# Patient Record
Sex: Male | Born: 1935
Health system: Southern US, Community
[De-identification: ages and names within clinical notes are randomized; demographics above are authoritative.]

## PROBLEM LIST (undated history)

## (undated) DIAGNOSIS — C61 Malignant neoplasm of prostate: Secondary | ICD-10-CM

## (undated) DIAGNOSIS — I10 Essential (primary) hypertension: Secondary | ICD-10-CM

## (undated) DIAGNOSIS — Z87442 Personal history of urinary calculi: Secondary | ICD-10-CM

## (undated) DIAGNOSIS — M1990 Unspecified osteoarthritis, unspecified site: Secondary | ICD-10-CM

## (undated) HISTORY — PX: PROSTATE BIOPSY: SHX241

## (undated) HISTORY — PX: EYE SURGERY: SHX253

---

## 2009-01-21 ENCOUNTER — Emergency Department (HOSPITAL_COMMUNITY): Admission: EM | Admit: 2009-01-21 | Discharge: 2009-01-21 | Payer: Self-pay | Admitting: Emergency Medicine

## 2010-04-26 ENCOUNTER — Telehealth (INDEPENDENT_AMBULATORY_CARE_PROVIDER_SITE_OTHER): Payer: Self-pay | Admitting: *Deleted

## 2010-04-30 ENCOUNTER — Encounter: Payer: Self-pay | Admitting: Internal Medicine

## 2010-06-08 ENCOUNTER — Ambulatory Visit (HOSPITAL_COMMUNITY): Admission: RE | Admit: 2010-06-08 | Discharge: 2010-06-08 | Payer: Self-pay | Admitting: Internal Medicine

## 2010-06-08 ENCOUNTER — Ambulatory Visit: Payer: Self-pay | Admitting: Internal Medicine

## 2010-06-12 ENCOUNTER — Encounter: Payer: Self-pay | Admitting: Internal Medicine

## 2010-12-18 NOTE — Letter (Signed)
Summary: TRAIGE ORDER  TRAIGE ORDER   Imported By: Ave Filter 04/30/2010 12:04:54  _____________________________________________________________________  External Attachment:    Type:   Image     Comment:   External Document

## 2010-12-18 NOTE — Letter (Signed)
Summary: Patient Notice, Colon Biopsy Results  [facility]  [address]   [place], [place] [address]   Phone: [phone]  Fax: [phone]       June 12, 2010   MONTOYA BRANDEL [address] [place], [place]  [address] 1934/01/04    Dear Mr. Woo,  I am pleased to inform you that the biopsies taken during your recent colonoscopy did not show any evidence of cancer upon pathologic examination.  Additional information/recommendations:  No further action is needed at this time.  Please follow-up with your primary care physician for your other healthcare needs.  You should have a repeat colonoscopy examination  in 7 years.  Please call us if you are having persistent problems or have questions about your condition that have not been fully answered at this time.  Sincerely,    R. Roetta Sessions MD, FACP [facility] Ph: [phone]    Fax: [phone]   Appended Document: Patient Notice, Colon Biopsy Results Letter mailed to pt.  Appended Document: Patient Notice, Colon Biopsy Results reminider in computer

## 2010-12-18 NOTE — Progress Notes (Signed)
Summary: [facility] T[facility]  Phone Note Call from Patient   Reason for Call: Talk to Nurse Summary of Call: Pt wants to [facility] his TCS that is scheduled for July 7th. Please call him back [phone]. He doesn't want it on a Monday. He said to just leave a message for the next available if he doesnt answer.  Initial call taken by: Diana Eves,  April 26, 2010 10:27 AM     Appended Document: [facility] TCS LMOM to call.  Appended Document: [facility] TCS Pt's appt is for 06/08/2010 @ 7:30 AM  and Kimm is aware.  Appended Document: [facility] TCS LMOM of the above. Pt to call back and confirm.  Appended Document: [facility] TCS LMOM to call and confirm.  Appended Document: [facility] TCS Pt called, i confirmed his new time for his TCS and time to arrive. Medications were reviewed. No changes in meds and no new problems.

## 2012-05-05 DIAGNOSIS — I10 Essential (primary) hypertension: Secondary | ICD-10-CM | POA: Diagnosis not present

## 2012-10-28 DIAGNOSIS — Z79899 Other long term (current) drug therapy: Secondary | ICD-10-CM | POA: Diagnosis not present

## 2012-10-28 DIAGNOSIS — I10 Essential (primary) hypertension: Secondary | ICD-10-CM | POA: Diagnosis not present

## 2012-11-04 DIAGNOSIS — N390 Urinary tract infection, site not specified: Secondary | ICD-10-CM | POA: Diagnosis not present

## 2012-11-04 DIAGNOSIS — I10 Essential (primary) hypertension: Secondary | ICD-10-CM | POA: Diagnosis not present

## 2012-11-04 DIAGNOSIS — Z23 Encounter for immunization: Secondary | ICD-10-CM | POA: Diagnosis not present

## 2013-05-06 DIAGNOSIS — I10 Essential (primary) hypertension: Secondary | ICD-10-CM | POA: Diagnosis not present

## 2013-11-01 DIAGNOSIS — N400 Enlarged prostate without lower urinary tract symptoms: Secondary | ICD-10-CM | POA: Diagnosis not present

## 2013-11-01 DIAGNOSIS — Z125 Encounter for screening for malignant neoplasm of prostate: Secondary | ICD-10-CM | POA: Diagnosis not present

## 2013-11-01 DIAGNOSIS — Z79899 Other long term (current) drug therapy: Secondary | ICD-10-CM | POA: Diagnosis not present

## 2013-11-01 DIAGNOSIS — I10 Essential (primary) hypertension: Secondary | ICD-10-CM | POA: Diagnosis not present

## 2013-11-08 DIAGNOSIS — Z23 Encounter for immunization: Secondary | ICD-10-CM | POA: Diagnosis not present

## 2013-11-08 DIAGNOSIS — I10 Essential (primary) hypertension: Secondary | ICD-10-CM | POA: Diagnosis not present

## 2014-05-09 DIAGNOSIS — I10 Essential (primary) hypertension: Secondary | ICD-10-CM | POA: Diagnosis not present

## 2014-05-09 DIAGNOSIS — K089 Disorder of teeth and supporting structures, unspecified: Secondary | ICD-10-CM | POA: Diagnosis not present

## 2014-11-02 DIAGNOSIS — I10 Essential (primary) hypertension: Secondary | ICD-10-CM | POA: Diagnosis not present

## 2014-11-02 DIAGNOSIS — Z125 Encounter for screening for malignant neoplasm of prostate: Secondary | ICD-10-CM | POA: Diagnosis not present

## 2014-11-02 DIAGNOSIS — Z79899 Other long term (current) drug therapy: Secondary | ICD-10-CM | POA: Diagnosis not present

## 2014-11-09 DIAGNOSIS — Z23 Encounter for immunization: Secondary | ICD-10-CM | POA: Diagnosis not present

## 2014-11-09 DIAGNOSIS — I10 Essential (primary) hypertension: Secondary | ICD-10-CM | POA: Diagnosis not present

## 2014-11-09 DIAGNOSIS — N400 Enlarged prostate without lower urinary tract symptoms: Secondary | ICD-10-CM | POA: Diagnosis not present

## 2014-11-09 DIAGNOSIS — N3000 Acute cystitis without hematuria: Secondary | ICD-10-CM | POA: Diagnosis not present

## 2014-12-01 DIAGNOSIS — L723 Sebaceous cyst: Secondary | ICD-10-CM | POA: Diagnosis not present

## 2014-12-01 DIAGNOSIS — D225 Melanocytic nevi of trunk: Secondary | ICD-10-CM | POA: Diagnosis not present

## 2015-05-10 DIAGNOSIS — Z125 Encounter for screening for malignant neoplasm of prostate: Secondary | ICD-10-CM | POA: Diagnosis not present

## 2015-05-25 DIAGNOSIS — N400 Enlarged prostate without lower urinary tract symptoms: Secondary | ICD-10-CM | POA: Diagnosis not present

## 2015-05-25 DIAGNOSIS — I10 Essential (primary) hypertension: Secondary | ICD-10-CM | POA: Diagnosis not present

## 2015-11-22 DIAGNOSIS — N400 Enlarged prostate without lower urinary tract symptoms: Secondary | ICD-10-CM | POA: Diagnosis not present

## 2015-11-22 DIAGNOSIS — Z79899 Other long term (current) drug therapy: Secondary | ICD-10-CM | POA: Diagnosis not present

## 2015-11-22 DIAGNOSIS — Z125 Encounter for screening for malignant neoplasm of prostate: Secondary | ICD-10-CM | POA: Diagnosis not present

## 2015-11-22 DIAGNOSIS — I10 Essential (primary) hypertension: Secondary | ICD-10-CM | POA: Diagnosis not present

## 2015-11-29 DIAGNOSIS — N3000 Acute cystitis without hematuria: Secondary | ICD-10-CM | POA: Diagnosis not present

## 2015-11-29 DIAGNOSIS — N390 Urinary tract infection, site not specified: Secondary | ICD-10-CM | POA: Diagnosis not present

## 2015-11-29 DIAGNOSIS — N400 Enlarged prostate without lower urinary tract symptoms: Secondary | ICD-10-CM | POA: Diagnosis not present

## 2015-11-29 DIAGNOSIS — Z6835 Body mass index (BMI) 35.0-35.9, adult: Secondary | ICD-10-CM | POA: Diagnosis not present

## 2015-11-29 DIAGNOSIS — I10 Essential (primary) hypertension: Secondary | ICD-10-CM | POA: Diagnosis not present

## 2016-05-23 DIAGNOSIS — N400 Enlarged prostate without lower urinary tract symptoms: Secondary | ICD-10-CM | POA: Diagnosis not present

## 2016-05-23 DIAGNOSIS — R972 Elevated prostate specific antigen [PSA]: Secondary | ICD-10-CM | POA: Diagnosis not present

## 2016-05-29 DIAGNOSIS — I10 Essential (primary) hypertension: Secondary | ICD-10-CM | POA: Diagnosis not present

## 2016-05-29 DIAGNOSIS — N400 Enlarged prostate without lower urinary tract symptoms: Secondary | ICD-10-CM | POA: Diagnosis not present

## 2016-08-30 DIAGNOSIS — Z23 Encounter for immunization: Secondary | ICD-10-CM | POA: Diagnosis not present

## 2016-11-27 DIAGNOSIS — Z79899 Other long term (current) drug therapy: Secondary | ICD-10-CM | POA: Diagnosis not present

## 2016-11-27 DIAGNOSIS — Z125 Encounter for screening for malignant neoplasm of prostate: Secondary | ICD-10-CM | POA: Diagnosis not present

## 2016-11-27 DIAGNOSIS — I10 Essential (primary) hypertension: Secondary | ICD-10-CM | POA: Diagnosis not present

## 2016-12-04 DIAGNOSIS — N400 Enlarged prostate without lower urinary tract symptoms: Secondary | ICD-10-CM | POA: Diagnosis not present

## 2016-12-04 DIAGNOSIS — Z6835 Body mass index (BMI) 35.0-35.9, adult: Secondary | ICD-10-CM | POA: Diagnosis not present

## 2016-12-04 DIAGNOSIS — I10 Essential (primary) hypertension: Secondary | ICD-10-CM | POA: Diagnosis not present

## 2016-12-30 DIAGNOSIS — B0089 Other herpesviral infection: Secondary | ICD-10-CM | POA: Diagnosis not present

## 2017-01-09 DIAGNOSIS — D040 Carcinoma in situ of skin of lip: Secondary | ICD-10-CM | POA: Diagnosis not present

## 2017-02-05 DIAGNOSIS — C4402 Squamous cell carcinoma of skin of lip: Secondary | ICD-10-CM | POA: Diagnosis not present

## 2017-02-05 DIAGNOSIS — D040 Carcinoma in situ of skin of lip: Secondary | ICD-10-CM | POA: Diagnosis not present

## 2017-03-10 DIAGNOSIS — D040 Carcinoma in situ of skin of lip: Secondary | ICD-10-CM | POA: Diagnosis not present

## 2017-03-31 DIAGNOSIS — D040 Carcinoma in situ of skin of lip: Secondary | ICD-10-CM | POA: Diagnosis not present

## 2017-03-31 DIAGNOSIS — C4402 Squamous cell carcinoma of skin of lip: Secondary | ICD-10-CM | POA: Diagnosis not present

## 2017-04-24 ENCOUNTER — Encounter: Payer: Self-pay | Admitting: Internal Medicine

## 2017-04-28 DIAGNOSIS — Z85828 Personal history of other malignant neoplasm of skin: Secondary | ICD-10-CM | POA: Diagnosis not present

## 2017-04-28 DIAGNOSIS — Z08 Encounter for follow-up examination after completed treatment for malignant neoplasm: Secondary | ICD-10-CM | POA: Diagnosis not present

## 2017-05-28 DIAGNOSIS — R972 Elevated prostate specific antigen [PSA]: Secondary | ICD-10-CM | POA: Diagnosis not present

## 2017-06-05 DIAGNOSIS — R972 Elevated prostate specific antigen [PSA]: Secondary | ICD-10-CM | POA: Diagnosis not present

## 2017-06-05 DIAGNOSIS — I10 Essential (primary) hypertension: Secondary | ICD-10-CM | POA: Diagnosis not present

## 2017-09-10 DIAGNOSIS — Z23 Encounter for immunization: Secondary | ICD-10-CM | POA: Diagnosis not present

## 2017-12-04 DIAGNOSIS — R972 Elevated prostate specific antigen [PSA]: Secondary | ICD-10-CM | POA: Diagnosis not present

## 2017-12-04 DIAGNOSIS — Z125 Encounter for screening for malignant neoplasm of prostate: Secondary | ICD-10-CM | POA: Diagnosis not present

## 2017-12-04 DIAGNOSIS — Z79899 Other long term (current) drug therapy: Secondary | ICD-10-CM | POA: Diagnosis not present

## 2017-12-04 DIAGNOSIS — I10 Essential (primary) hypertension: Secondary | ICD-10-CM | POA: Diagnosis not present

## 2017-12-11 DIAGNOSIS — N390 Urinary tract infection, site not specified: Secondary | ICD-10-CM | POA: Diagnosis not present

## 2017-12-11 DIAGNOSIS — I10 Essential (primary) hypertension: Secondary | ICD-10-CM | POA: Diagnosis not present

## 2017-12-11 DIAGNOSIS — Z6835 Body mass index (BMI) 35.0-35.9, adult: Secondary | ICD-10-CM | POA: Diagnosis not present

## 2017-12-11 DIAGNOSIS — N3000 Acute cystitis without hematuria: Secondary | ICD-10-CM | POA: Diagnosis not present

## 2018-01-14 DIAGNOSIS — R972 Elevated prostate specific antigen [PSA]: Secondary | ICD-10-CM | POA: Insufficient documentation

## 2018-01-16 DIAGNOSIS — N138 Other obstructive and reflux uropathy: Secondary | ICD-10-CM | POA: Diagnosis not present

## 2018-01-16 DIAGNOSIS — R972 Elevated prostate specific antigen [PSA]: Secondary | ICD-10-CM | POA: Diagnosis not present

## 2018-01-16 DIAGNOSIS — N401 Enlarged prostate with lower urinary tract symptoms: Secondary | ICD-10-CM | POA: Diagnosis not present

## 2018-04-16 DIAGNOSIS — N138 Other obstructive and reflux uropathy: Secondary | ICD-10-CM | POA: Diagnosis not present

## 2018-04-16 DIAGNOSIS — N401 Enlarged prostate with lower urinary tract symptoms: Secondary | ICD-10-CM | POA: Diagnosis not present

## 2018-04-16 DIAGNOSIS — R972 Elevated prostate specific antigen [PSA]: Secondary | ICD-10-CM | POA: Diagnosis not present

## 2018-05-15 DIAGNOSIS — R972 Elevated prostate specific antigen [PSA]: Secondary | ICD-10-CM | POA: Diagnosis not present

## 2018-05-15 DIAGNOSIS — N138 Other obstructive and reflux uropathy: Secondary | ICD-10-CM | POA: Diagnosis not present

## 2018-05-15 DIAGNOSIS — N401 Enlarged prostate with lower urinary tract symptoms: Secondary | ICD-10-CM | POA: Diagnosis not present

## 2018-06-04 DIAGNOSIS — R972 Elevated prostate specific antigen [PSA]: Secondary | ICD-10-CM | POA: Diagnosis not present

## 2018-06-04 DIAGNOSIS — I10 Essential (primary) hypertension: Secondary | ICD-10-CM | POA: Diagnosis not present

## 2018-07-01 DIAGNOSIS — R972 Elevated prostate specific antigen [PSA]: Secondary | ICD-10-CM | POA: Diagnosis not present

## 2018-07-13 DIAGNOSIS — R972 Elevated prostate specific antigen [PSA]: Secondary | ICD-10-CM | POA: Diagnosis not present

## 2018-09-30 DIAGNOSIS — Z23 Encounter for immunization: Secondary | ICD-10-CM | POA: Diagnosis not present

## 2018-11-02 DIAGNOSIS — R972 Elevated prostate specific antigen [PSA]: Secondary | ICD-10-CM | POA: Diagnosis not present

## 2018-11-02 DIAGNOSIS — C61 Malignant neoplasm of prostate: Secondary | ICD-10-CM | POA: Diagnosis not present

## 2018-11-26 DIAGNOSIS — I10 Essential (primary) hypertension: Secondary | ICD-10-CM | POA: Diagnosis not present

## 2018-11-26 DIAGNOSIS — R972 Elevated prostate specific antigen [PSA]: Secondary | ICD-10-CM | POA: Diagnosis not present

## 2018-11-26 DIAGNOSIS — Z125 Encounter for screening for malignant neoplasm of prostate: Secondary | ICD-10-CM | POA: Diagnosis not present

## 2018-11-26 DIAGNOSIS — Z79899 Other long term (current) drug therapy: Secondary | ICD-10-CM | POA: Diagnosis not present

## 2018-12-03 DIAGNOSIS — N183 Chronic kidney disease, stage 3 (moderate): Secondary | ICD-10-CM | POA: Diagnosis not present

## 2018-12-03 DIAGNOSIS — R972 Elevated prostate specific antigen [PSA]: Secondary | ICD-10-CM | POA: Diagnosis not present

## 2018-12-03 DIAGNOSIS — I129 Hypertensive chronic kidney disease with stage 1 through stage 4 chronic kidney disease, or unspecified chronic kidney disease: Secondary | ICD-10-CM | POA: Diagnosis not present

## 2019-02-01 DIAGNOSIS — R972 Elevated prostate specific antigen [PSA]: Secondary | ICD-10-CM | POA: Diagnosis not present

## 2019-05-03 DIAGNOSIS — R972 Elevated prostate specific antigen [PSA]: Secondary | ICD-10-CM | POA: Diagnosis not present

## 2019-05-03 DIAGNOSIS — C61 Malignant neoplasm of prostate: Secondary | ICD-10-CM | POA: Diagnosis not present

## 2019-05-31 DIAGNOSIS — R972 Elevated prostate specific antigen [PSA]: Secondary | ICD-10-CM | POA: Diagnosis not present

## 2019-05-31 DIAGNOSIS — N183 Chronic kidney disease, stage 3 (moderate): Secondary | ICD-10-CM | POA: Diagnosis not present

## 2019-05-31 DIAGNOSIS — N400 Enlarged prostate without lower urinary tract symptoms: Secondary | ICD-10-CM | POA: Diagnosis not present

## 2019-05-31 DIAGNOSIS — I10 Essential (primary) hypertension: Secondary | ICD-10-CM | POA: Diagnosis not present

## 2019-06-07 DIAGNOSIS — R972 Elevated prostate specific antigen [PSA]: Secondary | ICD-10-CM | POA: Diagnosis not present

## 2019-06-07 DIAGNOSIS — N183 Chronic kidney disease, stage 3 (moderate): Secondary | ICD-10-CM | POA: Diagnosis not present

## 2019-06-07 DIAGNOSIS — I10 Essential (primary) hypertension: Secondary | ICD-10-CM | POA: Diagnosis not present

## 2019-08-31 DIAGNOSIS — Z23 Encounter for immunization: Secondary | ICD-10-CM | POA: Diagnosis not present

## 2019-09-06 DIAGNOSIS — C61 Malignant neoplasm of prostate: Secondary | ICD-10-CM | POA: Diagnosis not present

## 2019-09-06 DIAGNOSIS — R972 Elevated prostate specific antigen [PSA]: Secondary | ICD-10-CM | POA: Diagnosis not present

## 2019-09-13 DIAGNOSIS — C61 Malignant neoplasm of prostate: Secondary | ICD-10-CM | POA: Diagnosis not present

## 2019-09-13 DIAGNOSIS — R972 Elevated prostate specific antigen [PSA]: Secondary | ICD-10-CM | POA: Diagnosis not present

## 2019-09-22 DIAGNOSIS — D4959 Neoplasm of unspecified behavior of other genitourinary organ: Secondary | ICD-10-CM | POA: Diagnosis not present

## 2019-09-22 DIAGNOSIS — C61 Malignant neoplasm of prostate: Secondary | ICD-10-CM | POA: Diagnosis not present

## 2019-09-23 ENCOUNTER — Encounter: Payer: Self-pay | Admitting: *Deleted

## 2019-09-23 ENCOUNTER — Other Ambulatory Visit: Payer: Self-pay | Admitting: Radiation Oncology

## 2019-09-23 ENCOUNTER — Ambulatory Visit
Admission: RE | Admit: 2019-09-23 | Discharge: 2019-09-23 | Disposition: A | Discharge status: Home / Self Care | Payer: Self-pay | Source: Ambulatory Visit | Attending: Radiation Oncology | Admitting: Radiation Oncology

## 2019-09-23 DIAGNOSIS — C61 Malignant neoplasm of prostate: Secondary | ICD-10-CM

## 2019-09-23 NOTE — Progress Notes (Signed)
GU Location of Tumor / Histology: prostatic adenocarcinoma  If Prostate Cancer, Gleason Score is (4 + 3) and PSA is (17.10). Prostate volume: 17.10  Alyse Low Bagwell has had three prostate biopsies. His first prostate biopsy was done in 2005 and turned out negative. His PSA continued to uptrend prompting another biopsy in June 2019 revealing Gleason 6 disease. The patient elevated to proceed with active surveillance at that time.  Biopsies of prostate (if applicable) revealed:    Past/Anticipated interventions by urology, if any: prostate biopsy x 3, referral to [facility]  Past/Anticipated interventions by medical oncology, if any: no  Weight changes, if any: denies  Bowel/Bladder complaints, if any: IPSS 2. SHIM 3. Denies dysuria or hematuria. Denies urinary leakage or incontinence. Denies any bowel complaints.  Nausea/Vomiting, if any: denies  Pain issues, if any:  denies  SAFETY ISSUES:  Prior radiation? denies  Pacemaker/ICD? denies  Possible current pregnancy? no, male patient  Is the patient on methotrexate? denies  Current Complaints / other details:  86 year old male. Owns and operates a Doctor, general practice. Seen by Dr. Tharon Aquas at [facility]. Per Rolland Porter notes patient isn't a surgical candidate, ADT wouldn't be beneficial compared to his age, and the patient desires to seek radiation therapy closer to home. MRI body/abd/pelvis done yesterday.

## 2019-09-24 ENCOUNTER — Other Ambulatory Visit: Payer: Self-pay

## 2019-09-24 ENCOUNTER — Encounter: Payer: Self-pay | Admitting: Radiation Oncology

## 2019-09-24 ENCOUNTER — Ambulatory Visit
Admission: RE | Admit: 2019-09-24 | Discharge: 2019-09-24 | Disposition: A | Discharge status: Home / Self Care | Payer: Medicare Other | Source: Ambulatory Visit | Attending: Radiation Oncology | Admitting: Radiation Oncology

## 2019-09-24 VITALS — Ht 74.0 in | Wt 257.0 lb

## 2019-09-24 DIAGNOSIS — D4959 Neoplasm of unspecified behavior of other genitourinary organ: Secondary | ICD-10-CM

## 2019-09-24 DIAGNOSIS — C61 Malignant neoplasm of prostate: Secondary | ICD-10-CM | POA: Insufficient documentation

## 2019-09-24 DIAGNOSIS — R972 Elevated prostate specific antigen [PSA]: Secondary | ICD-10-CM | POA: Diagnosis not present

## 2019-09-24 HISTORY — DX: Malignant neoplasm of prostate: C61

## 2019-09-24 HISTORY — DX: Essential (primary) hypertension: I10

## 2019-09-24 NOTE — Progress Notes (Signed)
See progress note under physician encounter. 

## 2019-09-24 NOTE — Progress Notes (Signed)
Radiation Oncology         [phone] ________________________________  Initial outpatient Consultation - Conducted via Telephone due to current COVID-19 concerns for limiting patient exposure  Name: Logan Olson MRN: [id number]  Date: 09/24/2019  DOB: June 03, 1934  CC:No primary care provider on file.  No ref. provider found   REFERRING PHYSICIAN: No ref. provider found  DIAGNOSIS: 85 y.o. 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    ICD-10-CM   1. Malignant neoplasm of prostate ([place])  C61     HISTORY OF PRESENT ILLNESS: Logan Olson is a 85 y.o. male with a diagnosis of prostate cancer. He has a long standing history of elevated PSA up to 9.31 in 2005.  He was referred to Dr. Lawerance Bach and underwent prostate biopsy at that time, which was negative.  He believes that he had an infection at that time and reports that his PSA normalized thereafter. The PSA was 4.78 in 03/2010 and decreased to 3.42 in 06/2010.  His PSA rose again to 10.27 in 03/2018 and has remained above 10 since that time.  He was referred back to Dr. Rosana Hoes in 01/2018 and has continued in routine follow up under his care since that time.  A digital rectal examination performed in 01/2018 did not demonstrate any concerning findings or nodules.  He had a prostate MRI on 07/01/2018,  For further evalaution and this revealed a 2.6 cm right base/mid PIRADS-5 lesion with findings suspicious for extra prostatic extension.  He then proceeded to repeat transrectal ultrasound with 12 biopsies of the prostate on 07/13/2018.  The prostate volume measured 95 cc.  Out of 13 core biopsies, 1 was positive.  The maximum Gleason score was 3+3, and this was seen in one right mid core. He elected to proceed with active surveillance at that time and has remained diligent in follow up.   His PSA has continued to rise, gradually since that time and most recently was up to 17.10 on  09/06/2019.  He had a repeat surveillance biopsy on 09/13/2019 with the prostate volume measuring 64.68 grams at that time. Out of 13 core biopsies, 4 were positive. The maximum Gleason score was 4+3 and this was seen in both right mid cores.  Additionally, Gleason 3+4 was seen in one left mid core (with perineural invasion) and Gleason 3+3 in one left apex core.  Given his advanced age, he was not felt to be a good surgical candidate so he was referred to Dr. Ysidro Evert in radiation oncology at [facility] to discuss treatment options. They discussed the role of ADT, which Dr. Ysidro Evert felt would be of little benefit and instead recommended hypofractionated radiotherapy alone. He also recommended prostate MRI and bone scan to complete his disease staging prior to proceeding with radiation therapy. The patient was in agreement with the recommendations but preferred to be treated closer to home.  Therefore, he has kindly been referred today for discussion of potential radiation treatment options locally.  PREVIOUS RADIATION THERAPY: No  PAST MEDICAL HISTORY:  Past Medical History:  Diagnosis Date  . Hypertension   . Prostate cancer ([place])       PAST SURGICAL HISTORY: Past Surgical History:  Procedure Laterality Date  . PROSTATE BIOPSY      FAMILY HISTORY:  Family History  Problem Relation Age of Onset  . Breast cancer Sister   . Colon cancer Neg Hx   . Pancreatic  cancer Neg Hx   . Prostate cancer Neg Hx     SOCIAL HISTORY:  Social History   Socioeconomic History  . Marital status: Married    Spouse name: Not on file  . Number of children: Not on file  . Years of education: Not on file  . Highest education level: Not on file  Occupational History  . Not on file  Social Needs  . Financial resource strain: Not on file  . Food insecurity    Worry: Not on file    Inability: Not on file  . Transportation needs    Medical: Not on file    Non-medical: Not on file  Tobacco Use   . Smoking status: Never Smoker  . Smokeless tobacco: Never Used  Substance and Sexual Activity  . Alcohol use: Never    Frequency: Never  . Drug use: Never  . Sexual activity: Not Currently  Lifestyle  . Physical activity    Days per week: Not on file    Minutes per session: Not on file  . Stress: Not on file  Relationships  . Social Herbalist on phone: Not on file    Gets together: Not on file    Attends religious service: Not on file    Active member of club or organization: Not on file    Attends meetings of clubs or organizations: Not on file    Relationship status: Not on file  . Intimate partner violence    Fear of current or ex partner: Not on file    Emotionally abused: Not on file    Physically abused: Not on file    Forced sexual activity: Not on file  Other Topics Concern  . Not on file  Social History Narrative  . Not on file    ALLERGIES: Patient has no known allergies.  MEDICATIONS:  Current Outpatient Medications  Medication Sig Dispense Refill  . amLODipine (NORVASC) 5 MG tablet     . losartan-hydrochlorothiazide (HYZAAR) 100-12.5 MG tablet Take by mouth.    . TELMISARTAN PO      No current facility-administered medications for this encounter.     REVIEW OF SYSTEMS:  On review of systems, the patient reports that he is doing well overall. He denies any chest pain, shortness of breath, cough, fevers, chills, night sweats, unintended weight changes. He denies any bowel disturbances, and denies abdominal pain, nausea or vomiting. He denies any new musculoskeletal or joint aches or pains. His IPSS was 2, indicating mild urinary symptoms. His SHIM was 3, indicating he has severe erectile dysfunction.  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  A complete review of systems is obtained and is otherwise negative.    PHYSICAL EXAM:  Wt Readings from Last 3 Encounters:  09/24/19 257 lb (116.6 kg)   Temp Readings from Last 3  Encounters:  No data found for Temp   BP Readings from Last 3 Encounters:  No data found for BP   Pulse Readings from Last 3 Encounters:  No data found for Pulse   Pain Assessment Pain Score: 0-No pain/10  Physical exam not performed in light of telephone encounter.   KPS = 90  100 - Normal; no complaints; no evidence of disease. 90   - Able to carry on normal activity; minor signs or symptoms of disease. 80   - Normal activity with effort; some signs or symptoms of disease. 22   - Cares for self; unable  to carry on normal activity or to do active work. 60   - Requires occasional assistance, but is able to care for most of his personal needs. 50   - Requires considerable assistance and frequent medical care. 23   - Disabled; requires special care and assistance. 57   - Severely disabled; hospital admission is indicated although death not imminent. 2   - Very sick; hospital admission necessary; active supportive treatment necessary. 10   - Moribund; fatal processes progressing rapidly. 0     - Dead  Karnofsky DA, Abelmann WH, Craver LS and Burchenal JH [phone]) The use of the nitrogen mustards in the palliative treatment of carcinoma: with particular reference to bronchogenic carcinoma Cancer 1 634-56  LABORATORY DATA:  No results found for: WBC, HGB, HCT, MCV, PLT No results found for: NA, K, CL, CO2 No results found for: ALT, AST, GGT, ALKPHOS, BILITOT   RADIOGRAPHY: No results found.    IMPRESSION/PLAN: This visit was conducted via Telephone to spare the patient unnecessary potential exposure in the healthcare setting during the current COVID-19 pandemic.  1. 85 y.o. gentleman with Stage T2c adenocarcinoma of the prostate with Gleason Score of 4+3, and PSA of 17.1. We discussed the patient's workup and outlined the nature of prostate cancer in this setting. The patient's T stage, Gleason's score, and PSA put him into the unfavorable intermediate risk group. Accordingly, he  is eligible for a variety of potential treatment options including brachytherapy or 5.5 weeks of external radiation. We discussed the available radiation techniques, and focused on the details and logistics and delivery. The patient is not felt to be an ideal candidate for brachytherapy with a prostate volume of 64.68 grams and advanced age. We discussed and outlined the risks, benefits, short and long-term effects associated with radiotherapy and compared and contrasted these with prostatectomy. We reviewed the role of ADT in the treatment of unfavorable intermediate risk prostate cancer and outlined the associated side effects that could be expected with this therapy. We agree with Dr. Wilson Singer recommendation to forego ADT at this time as the risks and negative impact on his quality of life would outweigh the very small benefit.  He and his wife were encouraged to ask questions that were answered to their stated satisfaction.  He appears to have a good understanding of his disease and our treatment recommendations which are of curative intent.  At the end of the conversation, the patient is interested in moving forward with 5.5 weeks of external beam therapy.  We will coordinate for a bone scan and CT A/P to be performed in the next week and pending there are no unexpected findings, will proceed with treatment planning accordingly.  We will share our discussion with Dr. Rosana Hoes and make arrangements for fiducial marker placement prior to simulation/treatment planning in anticipation of beginning prostate IMRT in the near future. He is comfortable with and in agreement with the stated plan.  Given current concerns for patient exposure during the COVID-19 pandemic, this encounter was conducted via telephone. The patient was notified in advance and was offered a MyChart meeting to allow for face to face communication but unfortunately reported that he did not have the appropriate resources/technology to support  such a visit and instead preferred to proceed with telephone consult. The patient has given verbal consent for this type of encounter. The time spent during this encounter was 45 minutes. The attendants for this meeting include Tyler Pita MD, Ashlyn Bruning PA-C, [place], patient,  Lucinda Dell and his wife. During the encounter, Tyler Pita MD, Ashlyn Bruning PA-C, and scribe, Wilburn Mylar were located at [place].  Patient, Logan Olson and his wife were located at home.    Nicholos Johns, PA-C    Tyler Pita, MD  [facility] Direct Dial: [phone]  Fax: [phone] [email or web address]  Skype  [email or web address]  This document serves as a record of services personally performed by Tyler Pita, MD and Freeman Caldron, PA-C. It was created on their behalf by Wilburn Mylar, a trained medical scribe. The creation of this record is based on the scribe's personal observations and the provider's statements to them. This document has been checked and approved by the attending provider.

## 2019-09-27 ENCOUNTER — Other Ambulatory Visit: Payer: Self-pay | Admitting: Urology

## 2019-09-27 DIAGNOSIS — D4959 Neoplasm of unspecified behavior of other genitourinary organ: Secondary | ICD-10-CM

## 2019-09-28 ENCOUNTER — Telehealth: Payer: Self-pay | Admitting: *Deleted

## 2019-09-28 NOTE — Telephone Encounter (Signed)
Called patient to inform of lab appt. On 10-04-19 @ 12 pm @ [place] and to pickup drink in Radiology for scan, scans to be on 10/05/19 - arrival time- 7:15 am @ [facility], pt. to be NPO- 4 hrs. prior to test, bone scan to follow CT, inject for bone scan @ 8 am and return to be read @ 11 am , pt. will be seen by Dr. Tresa Endo for placement of fid. markers on 10-13-19 - arrival time- 1:45 pm - address [place] [employer] ph. No. [phone], lvm for a return call

## 2019-10-04 ENCOUNTER — Other Ambulatory Visit: Payer: Self-pay

## 2019-10-04 ENCOUNTER — Ambulatory Visit
Admission: RE | Admit: 2019-10-04 | Discharge: 2019-10-04 | Disposition: A | Discharge status: Home / Self Care | Payer: Medicare Other | Source: Ambulatory Visit | Attending: Urology | Admitting: Urology

## 2019-10-04 ENCOUNTER — Other Ambulatory Visit: Payer: Self-pay | Admitting: Radiation Oncology

## 2019-10-04 DIAGNOSIS — C61 Malignant neoplasm of prostate: Secondary | ICD-10-CM

## 2019-10-04 LAB — BUN & CREATININE (CHCC)
BUN: 21 mg/dL (ref 8–23)
Creatinine: 1.17 mg/dL (ref 0.61–1.24)
GFR, Est AFR Am: >60 mL/min (ref >60)
GFR, Estimated: 57 mL/min — ABNORMAL LOW (ref >60)

## 2019-10-05 ENCOUNTER — Ambulatory Visit (HOSPITAL_COMMUNITY)
Admission: RE | Admit: 2019-10-05 | Discharge: 2019-10-05 | Disposition: A | Discharge status: Home / Self Care | Payer: Medicare Other | Source: Ambulatory Visit | Attending: Urology | Admitting: Urology

## 2019-10-05 ENCOUNTER — Encounter (HOSPITAL_COMMUNITY)
Admission: RE | Admit: 2019-10-05 | Discharge: 2019-10-05 | Disposition: A | Discharge status: Home / Self Care | Payer: Medicare Other | Source: Ambulatory Visit | Attending: Urology | Admitting: Urology

## 2019-10-05 ENCOUNTER — Encounter (HOSPITAL_COMMUNITY): Payer: Self-pay

## 2019-10-05 DIAGNOSIS — D4959 Neoplasm of unspecified behavior of other genitourinary organ: Secondary | ICD-10-CM

## 2019-10-05 DIAGNOSIS — C61 Malignant neoplasm of prostate: Secondary | ICD-10-CM | POA: Diagnosis not present

## 2019-10-05 MED ORDER — TECHNETIUM TC 99M MEDRONATE IV KIT
20.2000 | PACK | Freq: Once | INTRAVENOUS | Status: AC | PRN
  Administered 2019-10-05: 20.2 via INTRAVENOUS

## 2019-10-05 MED ORDER — SODIUM CHLORIDE (PF) 0.9 % IJ SOLN
INTRAMUSCULAR | Status: AC
  Filled 2019-10-05: qty 50

## 2019-10-05 MED ORDER — IOHEXOL 300 MG/ML  SOLN
100.0000 mL | Freq: Once | INTRAMUSCULAR | Status: AC | PRN
  Administered 2019-10-05: 08:00:00 100 mL via INTRAVENOUS

## 2019-10-07 ENCOUNTER — Telehealth: Payer: Self-pay | Admitting: Urology

## 2019-10-07 NOTE — Telephone Encounter (Signed)
I called and left a message on the patient's home phone requesting that he return my call to review the findings from his recent CT and bone scans.  The CT scan did show evidence of extracapsular extension of the prostate cancer through the posterior right prostate gland without evidence of abdominal or pelvic adenopathy or seminal vesicle involvement.  The bone scan was without evidence of osseous metastatic disease.  His imaging has been reviewed personally by Dr. Tammi Klippel who recommends proceeding with a 4-week course of hypofractionated radiotherapy to the prostate and pelvic lymph nodes in the absence of androgen deprivation therapy.  He is scheduled for fiducial marker placement with Dr. Rosana Hoes on 10/13/2019 and CT simulation/treatment planning on Tuesday, 10/19/2019.  Nicholos Johns, MMS, PA-C [place] at [place]: [phone]  Fax: [phone]

## 2019-10-08 ENCOUNTER — Telehealth: Payer: Self-pay | Admitting: Urology

## 2019-10-08 ENCOUNTER — Encounter: Payer: Self-pay | Admitting: Medical Oncology

## 2019-10-08 NOTE — Telephone Encounter (Signed)
I called and spoke with Mr. Logan Olson on 10/07/2019 to review the findings from his recent CT and bone scans and answered all of his questions.  He also requested that I call back on 10/08/2019 to review these findings and treatment plan with his wife, Logan Olson.   I spoke with Logan Olson this morning and informed her that the CT scan did show evidence of extracapsular extension of the prostate cancer through the posterior right prostate gland without evidence of abdominal or pelvic adenopathy or seminal vesicle involvement and that the bone scan was without evidence of osseous metastatic disease.  His imaging has been reviewed personally by Dr. Tammi Klippel who recommends proceeding with a 4-week course of hypofractionated radiotherapy to the prostate and pelvic lymph nodes in the absence of androgen deprivation therapy.    We reviewed his upcoming appointments in preparation for starting treatment including a follow-up visit for fiducial marker placement with Dr. Rosana Hoes at 1:45pm on 10/13/2019 and CT simulation/treatment planning  Here at the [facility] at 9:30am on Tuesday, 10/19/2019.  All of her questions were answered to her stated satisfaction and both she and Logan Olson are comfortable and in agreement with the stated plan.  I will also share this information with Dr. Rosana Hoes and we will move forward with treatment planning accordingly.  Logan Olson, MMS, PA-C [place] at [place]: [phone]  Fax: [phone]

## 2019-10-08 NOTE — Progress Notes (Signed)
Called patient and spoke with his wife, Ivin Booty to introduce myself as the prostate nurse navigator and discuss my role. He completed his staging studies and she spoke with Ashlyn today to discuss. He has an appointment with Dr. Rosana Hoes 11/25 for fiducial markers and 12/1 for CT simulation. I discussed with her the simulation process and they will receive a treatment schedule that day. She states he is very hard of hearing and she will need to come with him. I gave her my contact information and asked her to call me with questions or concerns. She voiced understanding.

## 2019-10-11 ENCOUNTER — Telehealth: Payer: Self-pay | Admitting: Medical Oncology

## 2019-10-11 NOTE — Telephone Encounter (Signed)
Sharon-wife called stating she received a call to confirm a telephone visit for 11/25. She thought this appointment was for fiducial markers and this cannot be done by telephone. I will follow up with Dr. Rosana Hoes' office and call her back. She voiced understanding.

## 2019-10-11 NOTE — Telephone Encounter (Signed)
Spoke with Junie Panning- Dr. Rosana Hoes, [facility] about fiducial marker appointment, 11/25. I explained that [place], received a call to confirm telephone visit 11/25. I explained that he was suppose to markers placed and have CT simulation 12/1. Per Junie Panning, Dr. Rosana Hoes, is not in clinic 11/25 and feels this appointment was  follow up to Dr. Rolland Porter radiation consult visit. I explained that patient was referred to Dr. Tammi Klippel in [facility] for radiation and Enid Derry scheduled this appointment.  Dr. Rosana Hoes is in the [facility] clinic on Monday and Junie Panning will message the [facility], to see if patient can get markers next Monday. She will call me back with an update.

## 2019-10-12 ENCOUNTER — Telehealth: Payer: Self-pay | Admitting: Medical Oncology

## 2019-10-12 NOTE — Telephone Encounter (Signed)
Erin- Dr. Rosana Hoes [facility] called to confirm appointment for fiducial markers 11/25 @ 2 pm. I called Sharon-wife and she voiced understanding and was very appreciative of my help.

## 2019-10-12 NOTE — Telephone Encounter (Signed)
I spoke with Ivin Booty, to let her know I am waiting for Erin-Dr. Rosana Hoes' office, to call me back with an appointment. She states she received a call from the nurse and it is scheduled for Wednesday. She asked if I would call back to confirm it is Wednesday. I called Junie Panning, left a message to confirm date and time.

## 2019-10-13 DIAGNOSIS — R972 Elevated prostate specific antigen [PSA]: Secondary | ICD-10-CM | POA: Diagnosis not present

## 2019-10-13 DIAGNOSIS — C61 Malignant neoplasm of prostate: Secondary | ICD-10-CM | POA: Diagnosis not present

## 2019-10-18 ENCOUNTER — Telehealth: Payer: Self-pay | Admitting: *Deleted

## 2019-10-18 NOTE — Telephone Encounter (Signed)
Returned patient's wife phone call, unable to leave message

## 2019-10-19 ENCOUNTER — Ambulatory Visit
Admission: RE | Admit: 2019-10-19 | Discharge: 2019-10-19 | Disposition: A | Discharge status: Home / Self Care | Payer: Medicare Other | Source: Ambulatory Visit | Attending: Radiation Oncology | Admitting: Radiation Oncology

## 2019-10-19 ENCOUNTER — Other Ambulatory Visit: Payer: Self-pay

## 2019-10-19 DIAGNOSIS — R300 Dysuria: Secondary | ICD-10-CM | POA: Diagnosis present

## 2019-10-19 DIAGNOSIS — C61 Malignant neoplasm of prostate: Secondary | ICD-10-CM | POA: Insufficient documentation

## 2019-10-22 DIAGNOSIS — C61 Malignant neoplasm of prostate: Secondary | ICD-10-CM | POA: Diagnosis not present

## 2019-10-22 NOTE — Progress Notes (Signed)
  Radiation Oncology         [phone] ________________________________  Name: Logan Olson MRN: [id number]  Date: 10/19/2019  DOB: 24-Jan-1934  SIMULATION AND TREATMENT PLANNING NOTE    ICD-10-CM   1. Malignant neoplasm of prostate ([place])  C61     DIAGNOSIS:  85 y.o. 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  NARRATIVE:  The patient was brought to the [place].  Identity was confirmed.  All relevant records and images related to the planned course of therapy were reviewed.  The patient freely provided informed written consent to proceed with treatment after reviewing the details related to the planned course of therapy. The consent form was witnessed and verified by the simulation staff.  Then, the patient was set-up in a stable reproducible supine position for radiation therapy.  A vacuum lock pillow device was custom fabricated to position his legs in a reproducible immobilized position.  Then, I performed a urethrogram under sterile conditions to identify the prostatic apex.  CT images were obtained.  Surface markings were placed.  The CT images were loaded into the planning software.  Then the prostate target and avoidance structures including the rectum, bladder, bowel and hips were contoured.  Treatment planning then occurred.  The radiation prescription was entered and confirmed.  A total of one complex treatment devices were fabricated. I have requested : Intensity Modulated Radiotherapy (IMRT) is medically necessary for this case for the following reason:  Rectal sparing.Marland Kitchen  PLAN:  The patient will receive 44 Gy in 20 fractions of 2.2 Gy, using a simultaneous integrated boost (SIB) to the prostate to a total dose of 60 Gy with 20 fractions of 3.0 Gy.  ________________________________  Sheral Apley Tammi Klippel, M.D.

## 2019-10-26 ENCOUNTER — Encounter: Payer: Self-pay | Admitting: Medical Oncology

## 2019-10-26 ENCOUNTER — Other Ambulatory Visit: Payer: Self-pay

## 2019-10-26 ENCOUNTER — Ambulatory Visit
Admission: RE | Admit: 2019-10-26 | Discharge: 2019-10-26 | Disposition: A | Discharge status: Home / Self Care | Payer: Medicare Other | Source: Ambulatory Visit | Attending: Radiation Oncology | Admitting: Radiation Oncology

## 2019-10-26 DIAGNOSIS — C61 Malignant neoplasm of prostate: Secondary | ICD-10-CM | POA: Diagnosis not present

## 2019-10-27 ENCOUNTER — Ambulatory Visit
Admission: RE | Admit: 2019-10-27 | Discharge: 2019-10-27 | Disposition: A | Discharge status: Home / Self Care | Payer: Medicare Other | Source: Ambulatory Visit | Attending: Radiation Oncology | Admitting: Radiation Oncology

## 2019-10-27 ENCOUNTER — Other Ambulatory Visit: Payer: Self-pay

## 2019-10-27 DIAGNOSIS — C61 Malignant neoplasm of prostate: Secondary | ICD-10-CM | POA: Diagnosis not present

## 2019-10-28 ENCOUNTER — Ambulatory Visit
Admission: RE | Admit: 2019-10-28 | Discharge: 2019-10-28 | Disposition: A | Discharge status: Home / Self Care | Payer: Medicare Other | Source: Ambulatory Visit | Attending: Radiation Oncology | Admitting: Radiation Oncology

## 2019-10-28 ENCOUNTER — Other Ambulatory Visit: Payer: Self-pay

## 2019-10-28 DIAGNOSIS — C61 Malignant neoplasm of prostate: Secondary | ICD-10-CM | POA: Diagnosis not present

## 2019-10-29 ENCOUNTER — Ambulatory Visit
Admission: RE | Admit: 2019-10-29 | Discharge: 2019-10-29 | Disposition: A | Discharge status: Home / Self Care | Payer: Medicare Other | Source: Ambulatory Visit | Attending: Radiation Oncology | Admitting: Radiation Oncology

## 2019-10-29 ENCOUNTER — Other Ambulatory Visit: Payer: Self-pay

## 2019-10-29 DIAGNOSIS — C61 Malignant neoplasm of prostate: Secondary | ICD-10-CM | POA: Diagnosis not present

## 2019-11-01 ENCOUNTER — Ambulatory Visit
Admission: RE | Admit: 2019-11-01 | Discharge: 2019-11-01 | Disposition: A | Discharge status: Home / Self Care | Payer: Medicare Other | Source: Ambulatory Visit | Attending: Radiation Oncology | Admitting: Radiation Oncology

## 2019-11-01 ENCOUNTER — Other Ambulatory Visit: Payer: Self-pay

## 2019-11-01 DIAGNOSIS — C61 Malignant neoplasm of prostate: Secondary | ICD-10-CM | POA: Diagnosis not present

## 2019-11-02 ENCOUNTER — Other Ambulatory Visit: Payer: Self-pay

## 2019-11-02 ENCOUNTER — Ambulatory Visit
Admission: RE | Admit: 2019-11-02 | Discharge: 2019-11-02 | Disposition: A | Discharge status: Home / Self Care | Payer: Medicare Other | Source: Ambulatory Visit | Attending: Radiation Oncology | Admitting: Radiation Oncology

## 2019-11-02 DIAGNOSIS — C61 Malignant neoplasm of prostate: Secondary | ICD-10-CM | POA: Diagnosis not present

## 2019-11-03 ENCOUNTER — Other Ambulatory Visit: Payer: Self-pay

## 2019-11-03 ENCOUNTER — Ambulatory Visit
Admission: RE | Admit: 2019-11-03 | Discharge: 2019-11-03 | Disposition: A | Discharge status: Home / Self Care | Payer: Medicare Other | Source: Ambulatory Visit | Attending: Radiation Oncology | Admitting: Radiation Oncology

## 2019-11-03 DIAGNOSIS — C61 Malignant neoplasm of prostate: Secondary | ICD-10-CM | POA: Diagnosis not present

## 2019-11-04 ENCOUNTER — Other Ambulatory Visit: Payer: Self-pay

## 2019-11-04 ENCOUNTER — Ambulatory Visit
Admission: RE | Admit: 2019-11-04 | Discharge: 2019-11-04 | Disposition: A | Discharge status: Home / Self Care | Payer: Medicare Other | Source: Ambulatory Visit | Attending: Radiation Oncology | Admitting: Radiation Oncology

## 2019-11-04 DIAGNOSIS — C61 Malignant neoplasm of prostate: Secondary | ICD-10-CM | POA: Diagnosis not present

## 2019-11-05 ENCOUNTER — Other Ambulatory Visit: Payer: Self-pay

## 2019-11-05 ENCOUNTER — Ambulatory Visit
Admission: RE | Admit: 2019-11-05 | Discharge: 2019-11-05 | Disposition: A | Discharge status: Home / Self Care | Payer: Medicare Other | Source: Ambulatory Visit | Attending: Radiation Oncology | Admitting: Radiation Oncology

## 2019-11-05 ENCOUNTER — Telehealth: Payer: Self-pay | Admitting: Radiation Oncology

## 2019-11-05 ENCOUNTER — Other Ambulatory Visit: Payer: Self-pay | Admitting: Radiation Oncology

## 2019-11-05 DIAGNOSIS — C61 Malignant neoplasm of prostate: Secondary | ICD-10-CM | POA: Diagnosis not present

## 2019-11-05 DIAGNOSIS — R300 Dysuria: Secondary | ICD-10-CM

## 2019-11-05 LAB — URINALYSIS, COMPLETE (UACMP) WITH MICROSCOPIC
Bilirubin Urine: NEGATIVE
Glucose, UA: NEGATIVE mg/dL
Hgb urine dipstick: NEGATIVE
Ketones, ur: NEGATIVE mg/dL
Nitrite: NEGATIVE
Protein, ur: NEGATIVE mg/dL
Specific Gravity, Urine: 1.019 (ref 1.005–1.030)
pH: 5 (ref 5.0–8.0)

## 2019-11-05 MED ORDER — CIPROFLOXACIN HCL 500 MG PO TABS: 500.0000 mg | ORAL_TABLET | Freq: Two times a day (BID) | ORAL | 0 refills | Status: DC

## 2019-11-05 MED ORDER — PHENAZOPYRIDINE HCL 200 MG PO TABS: 200.0000 mg | ORAL_TABLET | Freq: Three times a day (TID) | ORAL | 5 refills | Status: DC | PRN

## 2019-11-05 NOTE — Telephone Encounter (Signed)
-----   Message from Tyler Pita, MD sent at 11/05/2019 11:43 AM EST ----- Please phone patient, UA suggests possible infection.  E-prescribed CIPRO 500 BID x 7 days to [facility], pending culture

## 2019-11-05 NOTE — Telephone Encounter (Signed)
Phoned patient's home. No answer and no option to leave a message. Phoned another number and spoke with patient's wife. Explained that the first part of her husband's urine test suggest an infection. Explained Dr. Tammi Klippel electronically sent a prescription for a broad spectrum antibiotic and pyridium to [facility]. Explained that if the urine culture returns on Monday and the bacteria in his urine isn't sensitive to Cipro we may need to change up. Reviewed instructions for both medications. Ivin Booty, patient's wife, verbalized understanding of all reviewed.

## 2019-11-05 NOTE — Progress Notes (Signed)
Please phone patient, UA suggests possible infection.  E-prescribed CIPRO 500 BID x 7 days to [facility], pending culture

## 2019-11-06 LAB — URINE CULTURE: Culture: <10000 — AB

## 2019-11-08 ENCOUNTER — Telehealth: Payer: Self-pay | Admitting: Radiation Oncology

## 2019-11-08 ENCOUNTER — Ambulatory Visit
Admission: RE | Admit: 2019-11-08 | Discharge: 2019-11-08 | Disposition: A | Discharge status: Home / Self Care | Payer: Medicare Other | Source: Ambulatory Visit | Attending: Radiation Oncology | Admitting: Radiation Oncology

## 2019-11-08 ENCOUNTER — Other Ambulatory Visit: Payer: Self-pay

## 2019-11-08 DIAGNOSIS — C61 Malignant neoplasm of prostate: Secondary | ICD-10-CM | POA: Diagnosis not present

## 2019-11-08 NOTE — Telephone Encounter (Signed)
Phoned patient's cell. Wife, Ivin Booty, answered. Explained that Dr. Tammi Klippel would recommends her husband complete the course of Cipro prescribed. Explained the culture did not show a significant UTI but the analysis suggested it. She reports her husbands symptoms have improved while taking the Cipro. She reports that last week he began seeing intermittent small amounts of blood  in his urine. She reports they are unsure of the exact color (old or new) of the blood because the patient is also taking Pyridium. Explained the hematuria is likely to resolve with the Cipro but this RN would inform the providers of her concerns. Encouraged her to encourage him to increase his intake of water. She verbalized understanding of all reviewed and expressed appreciation for the return call.

## 2019-11-08 NOTE — Telephone Encounter (Signed)
Phoned patient's home. No answer. Left voicemail message detailing the following:In the setting of empiric treatment of UTI, don't be to concerned about a little bit of blood in his urine, especially while taking pyridium. Encouraged them to  keep an eye on it and if he's having progressive symptoms or symptoms a week after completing antibiotics, then he could come in for a repeat UA and culture. Provided my direct number for future questions or concerns.   Verbal permission given by patient for this RN to leave detailed message on voicemail.

## 2019-11-08 NOTE — Telephone Encounter (Signed)
In the setting of empiric treatment of UTI, I wouldn't be worried about a little bit of blood in his urine, especially while taking pyridium. I'd keep an eye on it and if he's having progressive symptoms or symptoms a week after completing antibiotics, then he could come in for a repeat UA and culture.

## 2019-11-09 ENCOUNTER — Other Ambulatory Visit: Payer: Self-pay

## 2019-11-09 ENCOUNTER — Ambulatory Visit
Admission: RE | Admit: 2019-11-09 | Discharge: 2019-11-09 | Disposition: A | Discharge status: Home / Self Care | Payer: Medicare Other | Source: Ambulatory Visit | Attending: Radiation Oncology | Admitting: Radiation Oncology

## 2019-11-09 DIAGNOSIS — C61 Malignant neoplasm of prostate: Secondary | ICD-10-CM | POA: Diagnosis not present

## 2019-11-10 ENCOUNTER — Other Ambulatory Visit: Payer: Self-pay

## 2019-11-10 ENCOUNTER — Ambulatory Visit
Admission: RE | Admit: 2019-11-10 | Discharge: 2019-11-10 | Disposition: A | Discharge status: Home / Self Care | Payer: Medicare Other | Source: Ambulatory Visit | Attending: Radiation Oncology | Admitting: Radiation Oncology

## 2019-11-10 DIAGNOSIS — C61 Malignant neoplasm of prostate: Secondary | ICD-10-CM | POA: Diagnosis not present

## 2019-11-11 ENCOUNTER — Ambulatory Visit
Admission: RE | Admit: 2019-11-11 | Discharge: 2019-11-11 | Disposition: A | Discharge status: Home / Self Care | Payer: Medicare Other | Source: Ambulatory Visit | Attending: Radiation Oncology | Admitting: Radiation Oncology

## 2019-11-11 ENCOUNTER — Other Ambulatory Visit: Payer: Self-pay

## 2019-11-11 DIAGNOSIS — C61 Malignant neoplasm of prostate: Secondary | ICD-10-CM | POA: Diagnosis not present

## 2019-11-15 ENCOUNTER — Ambulatory Visit
Admission: RE | Admit: 2019-11-15 | Discharge: 2019-11-15 | Disposition: A | Discharge status: Home / Self Care | Payer: Medicare Other | Source: Ambulatory Visit | Attending: Radiation Oncology | Admitting: Radiation Oncology

## 2019-11-15 ENCOUNTER — Other Ambulatory Visit: Payer: Self-pay

## 2019-11-15 DIAGNOSIS — C61 Malignant neoplasm of prostate: Secondary | ICD-10-CM | POA: Diagnosis not present

## 2019-11-16 ENCOUNTER — Other Ambulatory Visit: Payer: Self-pay

## 2019-11-16 ENCOUNTER — Ambulatory Visit
Admission: RE | Admit: 2019-11-16 | Discharge: 2019-11-16 | Disposition: A | Discharge status: Home / Self Care | Payer: Medicare Other | Source: Ambulatory Visit | Attending: Radiation Oncology | Admitting: Radiation Oncology

## 2019-11-16 DIAGNOSIS — C61 Malignant neoplasm of prostate: Secondary | ICD-10-CM | POA: Diagnosis not present

## 2019-11-17 ENCOUNTER — Other Ambulatory Visit: Payer: Self-pay

## 2019-11-17 ENCOUNTER — Ambulatory Visit
Admission: RE | Admit: 2019-11-17 | Discharge: 2019-11-17 | Disposition: A | Discharge status: Home / Self Care | Payer: Medicare Other | Source: Ambulatory Visit | Attending: Radiation Oncology | Admitting: Radiation Oncology

## 2019-11-17 DIAGNOSIS — C61 Malignant neoplasm of prostate: Secondary | ICD-10-CM | POA: Diagnosis not present

## 2019-11-18 ENCOUNTER — Other Ambulatory Visit: Payer: Self-pay

## 2019-11-18 ENCOUNTER — Ambulatory Visit
Admission: RE | Admit: 2019-11-18 | Discharge: 2019-11-18 | Disposition: A | Discharge status: Home / Self Care | Payer: Medicare Other | Source: Ambulatory Visit | Attending: Radiation Oncology | Admitting: Radiation Oncology

## 2019-11-18 ENCOUNTER — Other Ambulatory Visit: Payer: Self-pay | Admitting: Radiation Oncology

## 2019-11-18 DIAGNOSIS — C61 Malignant neoplasm of prostate: Secondary | ICD-10-CM | POA: Diagnosis not present

## 2019-11-18 MED ORDER — TAMSULOSIN HCL 0.4 MG PO CAPS: 0.4000 mg | ORAL_CAPSULE | Freq: Every day | ORAL | 5 refills | Status: DC

## 2019-11-18 MED ORDER — PHENAZOPYRIDINE HCL 200 MG PO TABS: 200.0000 mg | ORAL_TABLET | Freq: Three times a day (TID) | ORAL | 5 refills | Status: DC | PRN

## 2019-11-22 ENCOUNTER — Ambulatory Visit
Admission: RE | Admit: 2019-11-22 | Discharge: 2019-11-22 | Disposition: A | Discharge status: Home / Self Care | Payer: Medicare Other | Source: Ambulatory Visit | Attending: Radiation Oncology | Admitting: Radiation Oncology

## 2019-11-22 ENCOUNTER — Other Ambulatory Visit: Payer: Self-pay

## 2019-11-22 DIAGNOSIS — R300 Dysuria: Secondary | ICD-10-CM | POA: Insufficient documentation

## 2019-11-22 DIAGNOSIS — C61 Malignant neoplasm of prostate: Secondary | ICD-10-CM | POA: Insufficient documentation

## 2019-11-23 ENCOUNTER — Ambulatory Visit
Admission: RE | Admit: 2019-11-23 | Discharge: 2019-11-23 | Disposition: A | Discharge status: Home / Self Care | Payer: Medicare Other | Source: Ambulatory Visit | Attending: Radiation Oncology | Admitting: Radiation Oncology

## 2019-11-23 ENCOUNTER — Other Ambulatory Visit: Payer: Self-pay

## 2019-11-23 DIAGNOSIS — R300 Dysuria: Secondary | ICD-10-CM | POA: Diagnosis not present

## 2019-11-23 DIAGNOSIS — C61 Malignant neoplasm of prostate: Secondary | ICD-10-CM | POA: Diagnosis not present

## 2019-11-24 ENCOUNTER — Other Ambulatory Visit: Payer: Self-pay

## 2019-11-24 ENCOUNTER — Encounter: Payer: Self-pay | Admitting: Radiation Oncology

## 2019-11-24 ENCOUNTER — Ambulatory Visit
Admission: RE | Admit: 2019-11-24 | Discharge: 2019-11-24 | Disposition: A | Discharge status: Home / Self Care | Payer: Medicare Other | Source: Ambulatory Visit | Attending: Radiation Oncology | Admitting: Radiation Oncology

## 2019-11-24 DIAGNOSIS — R300 Dysuria: Secondary | ICD-10-CM | POA: Diagnosis not present

## 2019-11-24 DIAGNOSIS — C61 Malignant neoplasm of prostate: Secondary | ICD-10-CM | POA: Diagnosis not present

## 2019-11-25 ENCOUNTER — Encounter: Payer: Self-pay | Admitting: Medical Oncology

## 2019-11-28 NOTE — Progress Notes (Signed)
  Radiation Oncology         [phone] ________________________________  Name: Logan Olson MRN: [id number]  Date: 11/24/2019  DOB: 1934/03/02  End of Treatment Note  Diagnosis:   85 y.o. 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  Beams/energy:   The patient was treated with IMRT using volumetric arc therapy delivering 6 MV X-rays to clockwise and counterclockwise circumferential arcs with a 90 degree collimator offset to avoid dose scalloping.  Image guidance was performed with daily cone beam CT prior to each fraction to align to gold markers in the prostate and assure proper bladder and rectal fill volumes.  Immobilization was achieved with BodyFix custom mold.  Narrative: The patient tolerated radiation treatment relatively well.   The patient experienced some minor urinary irritation and modest fatigue.    Plan: The patient has completed radiation treatment. He will return to radiation oncology clinic for routine followup in one month. I advised him to call or return sooner if he has any questions or concerns related to his recovery or treatment. ________________________________  Sheral Apley. Tammi Klippel, M.D.

## 2019-11-29 ENCOUNTER — Telehealth: Payer: Self-pay | Admitting: Radiation Oncology

## 2019-11-29 NOTE — Telephone Encounter (Signed)
Received voicemail message from patient requesting a return call. Phoned patient back. Patient questions if he should continue taking "that purple pill." Determined the purple pill is pyridium. Patient endorses mild dysuria. Encouraged patient to continue pyridium until burning with urination completely resolved. Explained he has a refill of pyridium and would just need to contact his pharmacy to fill it. Patient verbalized understanding. Patient questions if he may get the covid vaccine. Explained he can from a radiation stand point there are no restrictions per Dr. Tammi Klippel. Patient verbalized understanding and expressed appreciation for the call back.

## 2019-12-06 DIAGNOSIS — I10 Essential (primary) hypertension: Secondary | ICD-10-CM | POA: Diagnosis not present

## 2019-12-06 DIAGNOSIS — Z125 Encounter for screening for malignant neoplasm of prostate: Secondary | ICD-10-CM | POA: Diagnosis not present

## 2019-12-06 DIAGNOSIS — Z79899 Other long term (current) drug therapy: Secondary | ICD-10-CM | POA: Diagnosis not present

## 2019-12-06 DIAGNOSIS — N183 Chronic kidney disease, stage 3 unspecified: Secondary | ICD-10-CM | POA: Diagnosis not present

## 2019-12-06 DIAGNOSIS — R972 Elevated prostate specific antigen [PSA]: Secondary | ICD-10-CM | POA: Diagnosis not present

## 2019-12-08 DIAGNOSIS — I10 Essential (primary) hypertension: Secondary | ICD-10-CM | POA: Diagnosis not present

## 2019-12-08 DIAGNOSIS — C61 Malignant neoplasm of prostate: Secondary | ICD-10-CM | POA: Diagnosis not present

## 2019-12-08 DIAGNOSIS — N1831 Chronic kidney disease, stage 3a: Secondary | ICD-10-CM | POA: Diagnosis not present

## 2019-12-08 DIAGNOSIS — D696 Thrombocytopenia, unspecified: Secondary | ICD-10-CM | POA: Diagnosis not present

## 2019-12-29 ENCOUNTER — Telehealth: Payer: Self-pay | Admitting: Radiation Oncology

## 2019-12-29 NOTE — Telephone Encounter (Signed)
Received voicemail message requesting return call. Phoned back. Spoke with wife since patient wasn't available. Wife explained her husband is confused about his follow up tomorrow. She/he question if it will be in person or via phone. Confirmed his follow up appointment tomorrow is with Freeman Caldron, PA-C over the phone. Explained Ashlyn will call him around 1330. Wife verbalized understanding and expressed her intentions to tell her husband.

## 2019-12-30 ENCOUNTER — Ambulatory Visit
Admission: RE | Admit: 2019-12-30 | Discharge: 2019-12-30 | Disposition: A | Discharge status: Home / Self Care | Payer: Medicare Other | Source: Ambulatory Visit | Attending: Urology | Admitting: Urology

## 2019-12-30 ENCOUNTER — Other Ambulatory Visit: Payer: Self-pay

## 2019-12-30 ENCOUNTER — Encounter: Payer: Self-pay | Admitting: Urology

## 2019-12-30 DIAGNOSIS — C61 Malignant neoplasm of prostate: Secondary | ICD-10-CM

## 2019-12-30 NOTE — Progress Notes (Signed)
Radiation Oncology         [phone]) [phone] ________________________________  Name: Logan Olson MRN: [id number]  Date: 12/30/2019  DOB: Feb 26, 1934  Post Treatment Note  CC: Patient, No Pcp Per  No ref. provider found  Diagnosis:   86 y.o. 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   Narrative:  I spoke with the patient to conduct his routine scheduled 1 month follow up visit via telephone to spare the patient unnecessary potential exposure in the healthcare setting during the current COVID-19 pandemic. The patient was notified in advance and gave permission to proceed with this visit format. He tolerated radiation treatment relatively well.   The patient experienced some minor urinary irritation and modest fatigue.                                On review of systems, the patient states that he is doing quite well in general.  He reports gradual improvement in his LUTS but continues with a weak flow of stream, increased frequency, urgency and occasional small volume leakage.  He specifically denies dysuria, gross hematuria, straining to void, or incomplete bladder emptying.  He has mild residual fatigue which is gradually improving as well.  He denies abdominal pain, nausea, vomiting, diarrhea or constipation.  Overall, he is quite pleased with his progress to date.  ALLERGIES:  has No Known Allergies.  Meds: Current Outpatient Medications  Medication Sig Dispense Refill  . amLODipine (NORVASC) 5 MG tablet     . ciprofloxacin (CIPRO) 500 MG tablet Take 1 tablet (500 mg total) by mouth 2 (two) times daily. 14 tablet 0  . hydrochlorothiazide (HYDRODIURIL) 25 MG tablet Take 25 mg by mouth daily.      Marland Kitchen losartan (COZAAR) 100 MG tablet Take 100 mg by mouth daily.    Marland Kitchen losartan-hydrochlorothiazide (HYZAAR) 100-12.5 MG tablet Take by mouth.    . phenazopyridine (PYRIDIUM) 200 MG tablet Take 1 tablet (200 mg total) by mouth 3 (three) times daily as needed for pain. 30 tablet 5  . tamsulosin (FLOMAX) 0.4 MG CAPS capsule Take 1 capsule (0.4 mg total) by mouth daily after supper. 30 capsule 5  . TELMISARTAN PO      No current facility-administered medications for this encounter.    Physical Findings:  vitals were not taken for this visit.   /10 Unable to assess due to telephone follow up visit format.  Lab Findings: No results found for: WBC, HGB, HCT, MCV, PLT   Radiographic Findings: No results found.  Impression/Plan: 1. 86 y.o. 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   He will continue to follow up with urology for ongoing PSA determinations.  He does not currently have a follow-up appointment scheduled with Dr. Rosana Hoes to his knowledge.  He plans to continue taking the Flomax daily, which was prescribed during his course of radiation, until his follow-up with Dr. Rosana Hoes and will discuss continuation of this medication at that time.  He understands what to expect with regards to PSA monitoring going forward.  I will look forward to following his response to treatment via correspondence with urology, and would be happy to continue to participate in his care if clinically indicated. I talked to the patient about what to expect in the future, including his risk for erectile dysfunction and rectal bleeding. I encouraged him to call or return to the office if he has any questions regarding his previous radiation or possible radiation side effects. He was comfortable with this plan and will follow up as needed.    Nicholos Johns, PA-C

## 2020-01-07 ENCOUNTER — Telehealth: Payer: Self-pay | Admitting: Medical Oncology

## 2020-01-07 NOTE — Telephone Encounter (Signed)
Called Dr. Chriss Czar Davis's office for follow up appointment. I was given 04-03-20 @3 :30 pm. I have called the [facility] office and left a message to see if I can get an earlier appointment for patient.

## 2020-01-07 NOTE — Telephone Encounter (Signed)
Spoke with patient to see if he has a follow up appointment with Dr. Rosana Hoes. He states he does not. I will call [facility] and get him an appointment.

## 2020-01-10 ENCOUNTER — Telehealth: Payer: Self-pay | Admitting: Medical Oncology

## 2020-01-10 NOTE — Telephone Encounter (Signed)
Called Dr. Shann Medal office to get an earlier appointment for patient. 4/12/2, @ 8 am, arriving 15 minutes early for check in. [facility] location)

## 2020-01-10 NOTE — Telephone Encounter (Signed)
Called patient and his wife with follow up appointment with Dr. Rosana Hoes, 02/28/19 @ 8 am. He needs to arrive 30 mintues early for check in at the [facility] location. He voiced understanding.

## 2020-02-28 DIAGNOSIS — C61 Malignant neoplasm of prostate: Secondary | ICD-10-CM | POA: Diagnosis not present

## 2020-02-28 DIAGNOSIS — R972 Elevated prostate specific antigen [PSA]: Secondary | ICD-10-CM | POA: Diagnosis not present

## 2020-05-31 DIAGNOSIS — Z79899 Other long term (current) drug therapy: Secondary | ICD-10-CM | POA: Diagnosis not present

## 2020-05-31 DIAGNOSIS — I10 Essential (primary) hypertension: Secondary | ICD-10-CM | POA: Diagnosis not present

## 2020-05-31 DIAGNOSIS — N183 Chronic kidney disease, stage 3 unspecified: Secondary | ICD-10-CM | POA: Diagnosis not present

## 2020-05-31 DIAGNOSIS — D696 Thrombocytopenia, unspecified: Secondary | ICD-10-CM | POA: Diagnosis not present

## 2020-05-31 DIAGNOSIS — C61 Malignant neoplasm of prostate: Secondary | ICD-10-CM | POA: Diagnosis not present

## 2020-06-07 DIAGNOSIS — D696 Thrombocytopenia, unspecified: Secondary | ICD-10-CM | POA: Diagnosis not present

## 2020-06-07 DIAGNOSIS — I10 Essential (primary) hypertension: Secondary | ICD-10-CM | POA: Diagnosis not present

## 2020-09-04 DIAGNOSIS — Z23 Encounter for immunization: Secondary | ICD-10-CM | POA: Diagnosis not present

## 2020-09-04 DIAGNOSIS — C61 Malignant neoplasm of prostate: Secondary | ICD-10-CM | POA: Diagnosis not present

## 2020-09-04 DIAGNOSIS — R972 Elevated prostate specific antigen [PSA]: Secondary | ICD-10-CM | POA: Diagnosis not present

## 2020-09-19 DIAGNOSIS — H524 Presbyopia: Secondary | ICD-10-CM | POA: Diagnosis not present

## 2020-09-19 DIAGNOSIS — H2513 Age-related nuclear cataract, bilateral: Secondary | ICD-10-CM | POA: Diagnosis not present

## 2020-11-14 DIAGNOSIS — Z23 Encounter for immunization: Secondary | ICD-10-CM | POA: Diagnosis not present

## 2020-12-06 DIAGNOSIS — I10 Essential (primary) hypertension: Secondary | ICD-10-CM | POA: Diagnosis not present

## 2020-12-06 DIAGNOSIS — Z79899 Other long term (current) drug therapy: Secondary | ICD-10-CM | POA: Diagnosis not present

## 2020-12-06 DIAGNOSIS — D696 Thrombocytopenia, unspecified: Secondary | ICD-10-CM | POA: Diagnosis not present

## 2020-12-06 DIAGNOSIS — N183 Chronic kidney disease, stage 3 unspecified: Secondary | ICD-10-CM | POA: Diagnosis not present

## 2020-12-06 DIAGNOSIS — Z8546 Personal history of malignant neoplasm of prostate: Secondary | ICD-10-CM | POA: Diagnosis not present

## 2020-12-13 DIAGNOSIS — D696 Thrombocytopenia, unspecified: Secondary | ICD-10-CM | POA: Diagnosis not present

## 2020-12-13 DIAGNOSIS — I10 Essential (primary) hypertension: Secondary | ICD-10-CM | POA: Diagnosis not present

## 2020-12-13 DIAGNOSIS — N1831 Chronic kidney disease, stage 3a: Secondary | ICD-10-CM | POA: Diagnosis not present

## 2021-03-22 DIAGNOSIS — C61 Malignant neoplasm of prostate: Secondary | ICD-10-CM | POA: Diagnosis not present

## 2021-03-22 DIAGNOSIS — R972 Elevated prostate specific antigen [PSA]: Secondary | ICD-10-CM | POA: Diagnosis not present

## 2021-06-14 DIAGNOSIS — I10 Essential (primary) hypertension: Secondary | ICD-10-CM | POA: Diagnosis not present

## 2021-06-14 DIAGNOSIS — R001 Bradycardia, unspecified: Secondary | ICD-10-CM | POA: Diagnosis not present

## 2021-06-20 DIAGNOSIS — Z23 Encounter for immunization: Secondary | ICD-10-CM | POA: Diagnosis not present

## 2021-07-18 DIAGNOSIS — I10 Essential (primary) hypertension: Secondary | ICD-10-CM | POA: Diagnosis not present

## 2021-07-18 DIAGNOSIS — C61 Malignant neoplasm of prostate: Secondary | ICD-10-CM | POA: Diagnosis not present

## 2021-07-26 DIAGNOSIS — L980 Pyogenic granuloma: Secondary | ICD-10-CM | POA: Diagnosis not present

## 2021-07-26 DIAGNOSIS — X32XXXD Exposure to sunlight, subsequent encounter: Secondary | ICD-10-CM | POA: Diagnosis not present

## 2021-07-26 DIAGNOSIS — L570 Actinic keratosis: Secondary | ICD-10-CM | POA: Diagnosis not present

## 2021-09-17 DIAGNOSIS — R001 Bradycardia, unspecified: Secondary | ICD-10-CM | POA: Diagnosis not present

## 2021-09-17 DIAGNOSIS — Z23 Encounter for immunization: Secondary | ICD-10-CM | POA: Diagnosis not present

## 2021-09-17 DIAGNOSIS — I10 Essential (primary) hypertension: Secondary | ICD-10-CM | POA: Diagnosis not present

## 2021-10-25 DIAGNOSIS — R972 Elevated prostate specific antigen [PSA]: Secondary | ICD-10-CM | POA: Diagnosis not present

## 2021-10-25 DIAGNOSIS — C61 Malignant neoplasm of prostate: Secondary | ICD-10-CM | POA: Diagnosis not present

## 2021-12-17 DIAGNOSIS — Z79899 Other long term (current) drug therapy: Secondary | ICD-10-CM | POA: Diagnosis not present

## 2021-12-17 DIAGNOSIS — I10 Essential (primary) hypertension: Secondary | ICD-10-CM | POA: Diagnosis not present

## 2021-12-17 DIAGNOSIS — Z8500 Personal history of malignant neoplasm of unspecified digestive organ: Secondary | ICD-10-CM | POA: Diagnosis not present

## 2021-12-17 DIAGNOSIS — R001 Bradycardia, unspecified: Secondary | ICD-10-CM | POA: Diagnosis not present

## 2021-12-24 DIAGNOSIS — I10 Essential (primary) hypertension: Secondary | ICD-10-CM | POA: Diagnosis not present

## 2021-12-24 DIAGNOSIS — Z8500 Personal history of malignant neoplasm of unspecified digestive organ: Secondary | ICD-10-CM | POA: Diagnosis not present

## 2022-03-22 DIAGNOSIS — M546 Pain in thoracic spine: Secondary | ICD-10-CM | POA: Diagnosis not present

## 2022-04-01 ENCOUNTER — Other Ambulatory Visit (HOSPITAL_COMMUNITY): Payer: Self-pay | Admitting: Internal Medicine

## 2022-04-01 DIAGNOSIS — M545 Low back pain, unspecified: Secondary | ICD-10-CM

## 2022-04-02 ENCOUNTER — Ambulatory Visit (HOSPITAL_COMMUNITY)
Admission: RE | Admit: 2022-04-02 | Discharge: 2022-04-02 | Disposition: A | Discharge status: Home / Self Care | Payer: Medicare Other | Source: Ambulatory Visit | Attending: Internal Medicine | Admitting: Internal Medicine

## 2022-04-02 DIAGNOSIS — M545 Low back pain, unspecified: Secondary | ICD-10-CM | POA: Diagnosis not present

## 2022-04-25 DIAGNOSIS — R972 Elevated prostate specific antigen [PSA]: Secondary | ICD-10-CM | POA: Diagnosis not present

## 2022-04-25 DIAGNOSIS — C61 Malignant neoplasm of prostate: Secondary | ICD-10-CM | POA: Diagnosis not present

## 2022-05-13 DIAGNOSIS — M5430 Sciatica, unspecified side: Secondary | ICD-10-CM | POA: Diagnosis not present

## 2022-05-13 DIAGNOSIS — M791 Myalgia, unspecified site: Secondary | ICD-10-CM | POA: Diagnosis not present

## 2022-05-13 DIAGNOSIS — M549 Dorsalgia, unspecified: Secondary | ICD-10-CM | POA: Diagnosis not present

## 2022-05-15 ENCOUNTER — Other Ambulatory Visit: Payer: Self-pay

## 2022-05-15 ENCOUNTER — Emergency Department (HOSPITAL_COMMUNITY)
Admission: EM | Admit: 2022-05-15 | Discharge: 2022-05-15 | Disposition: A | Discharge status: Home / Self Care | Payer: Medicare Other | Attending: Emergency Medicine | Admitting: Emergency Medicine

## 2022-05-15 ENCOUNTER — Encounter (HOSPITAL_COMMUNITY): Payer: Self-pay | Admitting: Emergency Medicine

## 2022-05-15 DIAGNOSIS — M545 Low back pain, unspecified: Secondary | ICD-10-CM | POA: Diagnosis present

## 2022-05-15 DIAGNOSIS — M5441 Lumbago with sciatica, right side: Secondary | ICD-10-CM | POA: Insufficient documentation

## 2022-05-15 DIAGNOSIS — I10 Essential (primary) hypertension: Secondary | ICD-10-CM | POA: Insufficient documentation

## 2022-05-15 DIAGNOSIS — Z8546 Personal history of malignant neoplasm of prostate: Secondary | ICD-10-CM | POA: Diagnosis not present

## 2022-05-15 DIAGNOSIS — M5431 Sciatica, right side: Secondary | ICD-10-CM

## 2022-05-15 MED ORDER — OXYCODONE HCL 5 MG PO TABS: 5.0000 mg | ORAL_TABLET | ORAL | 0 refills | Status: DC | PRN

## 2022-05-15 MED ORDER — MORPHINE SULFATE (PF) 4 MG/ML IV SOLN
4.0000 mg | Freq: Once | INTRAVENOUS | Status: AC
  Administered 2022-05-15: 4 mg via INTRAMUSCULAR
  Filled 2022-05-15: qty 1

## 2022-05-15 MED ORDER — PREDNISONE 50 MG PO TABS
60.0000 mg | ORAL_TABLET | Freq: Once | ORAL | Status: AC
  Administered 2022-05-15: 60 mg via ORAL
  Filled 2022-05-15: qty 1

## 2022-05-15 MED ORDER — PREDNISONE 20 MG PO TABS: 40.0000 mg | ORAL_TABLET | Freq: Every day | ORAL | 0 refills | Status: AC

## 2022-05-15 NOTE — ED Triage Notes (Signed)
Pt c/o back pain that radiates down right leg since Saturday. Pt was seen at urgent care for the same and started on muscle relaxers and steroids.

## 2022-05-15 NOTE — Discharge Instructions (Addendum)
You were evaluated in the Emergency Department and after careful evaluation, we did not find any emergent condition requiring admission or further testing in the hospital.  Your exam/testing today was overall reassuring.  Symptoms seem to be due to sciatica.  Recommend taking the steroid medication as directed for the next few days, take your first home dose when you wake up on 6/29.  Recommend continued use of Tylenol 1000 mg every 4-6 hours for pain.  Recommend avoiding Aleve or ibuprofen while taking the prednisone as these medicines in combination could irritate your stomach.  Can continue using the muscle relaxer as needed.  Can use the oxycodone medication prescribed if having trouble sleeping at night due to pain.  Would not recommend taking during the day, please do not take the muscle relaxer and the oxycodone at the same time.  Please return to the Emergency Department if you experience any worsening of your condition.  Thank you for allowing [place] to be a part of your care.

## 2022-05-15 NOTE — ED Provider Notes (Signed)
[facility] Emergency Department Provider Note MRN:  [id number]  Arrival date & time: 05/15/22     Chief Complaint   Back Pain   History of Present Illness   CHRISTHOPER Olson is a 88 y.o. year-old male with a history of prostate cancer presenting to the ED with chief complaint of back pain.  4 days of right lower back and buttock pain with radiation down the back of the right leg.  Went to an urgent care and received a steroid shot, was feeling better today and so he mowed the lawn.  Pain is much worse now this evening, cannot sleep.  No bowel or bladder dysfunction, no numbness or weakness to the arms or legs, no fall.  Review of Systems  A thorough review of systems was obtained and all systems are negative except as noted in the HPI and PMH.   Patient's Health History    Past Medical History:  Diagnosis Date   Hypertension    Prostate cancer ([place]) dx'd 07/13/18    Past Surgical History:  Procedure Laterality Date   PROSTATE BIOPSY      Family History  Problem Relation Age of Onset   Breast cancer Sister    Colon cancer Neg Hx    Pancreatic cancer Neg Hx    Prostate cancer Neg Hx     Social History   Socioeconomic History   Marital status: Married    Spouse name: Not on file   Number of children: Not on file   Years of education: Not on file   Highest education level: Not on file  Occupational History   Not on file  Tobacco Use   Smoking status: Never   Smokeless tobacco: Never  Vaping Use   Vaping Use: Never used  Substance and Sexual Activity   Alcohol use: Never   Drug use: Never   Sexual activity: Not Currently  Other Topics Concern   Not on file  Social History Narrative   Not on file   Social Determinants of Health   Financial Resource Strain: Not on file  Food Insecurity: Not on file  Transportation Needs: Not on file  Physical Activity: Not on file  Stress: Not on file  Social Connections: Not on file  Intimate  Partner Violence: Not on file     Physical Exam   Vitals:   05/15/22 0212  BP: 135/79  Pulse: (!) 55  Resp: 18  Temp: 97.6 F (36.4 C)  SpO2: 96%    CONSTITUTIONAL: Well-appearing, NAD NEURO/PSYCH:  Alert and oriented x 3, no focal deficits EYES:  eyes equal and reactive ENT/NECK:  no LAD, no JVD CARDIO: Regular rate, well-perfused, normal S1 and S2 PULM:  CTAB no wheezing or rhonchi GI/GU:  non-distended, non-tender MSK/SPINE:  No gross deformities, no edema SKIN:  no rash, atraumatic   *Additional and/or pertinent findings included in MDM below  Diagnostic and Interventional Summary    EKG Interpretation  Date/Time:    Ventricular Rate:    PR Interval:    QRS Duration:   QT Interval:    QTC Calculation:   R Axis:     Text Interpretation:         Labs Reviewed - No data to display  No orders to display    Medications  morphine (PF) 4 MG/ML injection 4 mg (4 mg Intramuscular Given 05/15/22 0233)  predniSONE (DELTASONE) tablet 60 mg (60 mg Oral Given 05/15/22 0233)     Procedures  /  Critical Care Procedures  ED Course and Medical Decision Making  Initial Impression and Ddx Well-appearing, reassuring neurological exam, normal and symmetric strength and sensation, normal coordination, normal speech.  No red flag symptoms to suggest myelopathy.  Patient has a history of prostate cancer and so imaging was considered to screen for any signs of spread to the spine.  However lumbar x-ray was obtained a month ago and did not show any evidence of this and so imaging not indicated this evening.  Patient mostly here for improved pain control.  Possible that he reaggravated the sciatica by mowing the lawn or the steroid shot has worn off and he has rebound symptoms.  Providing a more prolonged steroid burst, further pain control.  Past medical/surgical history that increases complexity of ED encounter: Prostate cancer  Interpretation of Diagnostics Laboratory and/or  imaging options to aid in the diagnosis/care of the patient were considered.  After careful history and physical examination, it was determined that there was no indication for diagnostics at this time.  Patient Reassessment and Ultimate Disposition/Management     Discharge home  Patient management required discussion with the following services or consulting groups:  None  Complexity of Problems Addressed Acute illness or injury that poses threat of life of bodily function  Additional Data Reviewed and Analyzed Further history obtained from: Further history from spouse/family member  Additional Factors Impacting ED Encounter Risk Prescriptions  Barth Kirks. Sedonia Small, [place] [email or web address]  Final Clinical Impressions(s) / ED Diagnoses     ICD-10-CM   1. Sciatica of right side  M54.31       ED Discharge Orders          Ordered    predniSONE (DELTASONE) 20 MG tablet  Daily        05/15/22 0234    oxyCODONE (ROXICODONE) 5 MG immediate release tablet  Every 4 hours PRN        05/15/22 0234             Discharge Instructions Discussed with and Provided to Patient:    Discharge Instructions      You were evaluated in the Emergency Department and after careful evaluation, we did not find any emergent condition requiring admission or further testing in the hospital.  Your exam/testing today was overall reassuring.  Symptoms seem to be due to sciatica.  Recommend taking the steroid medication as directed for the next few days, take your first home dose when you wake up on 6/29.  Recommend continued use of Tylenol 1000 mg every 4-6 hours for pain.  Recommend avoiding Aleve or ibuprofen while taking the prednisone as these medicines in combination could irritate your stomach.  Can continue using the muscle relaxer as needed.  Can use the oxycodone medication prescribed if having trouble sleeping at night due to pain.   Would not recommend taking during the day, please do not take the muscle relaxer and the oxycodone at the same time.  Please return to the Emergency Department if you experience any worsening of your condition.  Thank you for allowing [place] to be a part of your care.       Maudie Flakes, MD 05/15/22 [phone]

## 2022-05-17 DIAGNOSIS — I10 Essential (primary) hypertension: Secondary | ICD-10-CM | POA: Diagnosis not present

## 2022-05-17 DIAGNOSIS — C61 Malignant neoplasm of prostate: Secondary | ICD-10-CM | POA: Diagnosis not present

## 2022-05-18 ENCOUNTER — Encounter (HOSPITAL_COMMUNITY): Payer: Self-pay

## 2022-05-18 ENCOUNTER — Other Ambulatory Visit: Payer: Self-pay

## 2022-05-18 ENCOUNTER — Emergency Department (HOSPITAL_COMMUNITY): Payer: Medicare Other

## 2022-05-18 ENCOUNTER — Emergency Department (HOSPITAL_COMMUNITY)
Admission: EM | Admit: 2022-05-18 | Discharge: 2022-05-18 | Disposition: A | Discharge status: Home / Self Care | Payer: Medicare Other | Attending: Emergency Medicine | Admitting: Emergency Medicine

## 2022-05-18 DIAGNOSIS — Z8546 Personal history of malignant neoplasm of prostate: Secondary | ICD-10-CM | POA: Insufficient documentation

## 2022-05-18 DIAGNOSIS — M5126 Other intervertebral disc displacement, lumbar region: Secondary | ICD-10-CM | POA: Diagnosis not present

## 2022-05-18 DIAGNOSIS — M545 Low back pain, unspecified: Secondary | ICD-10-CM | POA: Diagnosis present

## 2022-05-18 DIAGNOSIS — K5730 Diverticulosis of large intestine without perforation or abscess without bleeding: Secondary | ICD-10-CM | POA: Diagnosis not present

## 2022-05-18 DIAGNOSIS — K3189 Other diseases of stomach and duodenum: Secondary | ICD-10-CM | POA: Diagnosis not present

## 2022-05-18 DIAGNOSIS — M5136 Other intervertebral disc degeneration, lumbar region: Secondary | ICD-10-CM | POA: Diagnosis not present

## 2022-05-18 DIAGNOSIS — M51369 Other intervertebral disc degeneration, lumbar region without mention of lumbar back pain or lower extremity pain: Secondary | ICD-10-CM

## 2022-05-18 LAB — URINALYSIS, ROUTINE W REFLEX MICROSCOPIC
Bilirubin Urine: NEGATIVE
Glucose, UA: NEGATIVE mg/dL
Hgb urine dipstick: NEGATIVE
Ketones, ur: NEGATIVE mg/dL
Leukocytes,Ua: NEGATIVE
Nitrite: NEGATIVE
Protein, ur: NEGATIVE mg/dL
Specific Gravity, Urine: 1.019 (ref 1.005–1.030)
pH: 6 (ref 5.0–8.0)

## 2022-05-18 MED ORDER — PREDNISONE 20 MG PO TABS: 40.0000 mg | ORAL_TABLET | Freq: Every day | ORAL | 0 refills | Status: DC

## 2022-05-18 MED ORDER — SODIUM CHLORIDE 0.9 % IV BOLUS
500.0000 mL | Freq: Once | INTRAVENOUS | Status: AC
  Administered 2022-05-18: 500 mL via INTRAVENOUS

## 2022-05-18 MED ORDER — METHYL SALICYLATE-LIDO-MENTHOL 4-4-5 % EX PTCH: 1.0000 | MEDICATED_PATCH | Freq: Two times a day (BID) | CUTANEOUS | 0 refills | Status: AC

## 2022-05-18 MED ORDER — DICLOFENAC EPOLAMINE 1.3 % EX PTCH
1.0000 | MEDICATED_PATCH | Freq: Two times a day (BID) | CUTANEOUS | Status: DC
  Administered 2022-05-18: 1 via TRANSDERMAL
  Filled 2022-05-18: qty 1

## 2022-05-18 MED ORDER — MORPHINE SULFATE (PF) 4 MG/ML IV SOLN
4.0000 mg | Freq: Once | INTRAVENOUS | Status: AC
  Administered 2022-05-18: 4 mg via INTRAVENOUS
  Filled 2022-05-18: qty 1

## 2022-05-18 NOTE — ED Triage Notes (Signed)
Patient presents to the ED with right lower back pain that radiates down right leg.  Patient states that he was seen for same on Thursday of this past week.

## 2022-05-18 NOTE — Discharge Instructions (Signed)
As discussed, you have been diagnosed with a bulge or herniation of the disc between 2 of your lower spine vertebrae.  Please take all medication as prescribed, and follow-up with either our neurosurgery or orthopedic surgery colleagues.  Return here for concerning changes in your condition.

## 2022-05-18 NOTE — ED Provider Notes (Signed)
[facility] EMERGENCY DEPARTMENT Provider Note   CSN: [id number] Arrival date & time: 05/18/22  0831     History  Chief Complaint  Patient presents with   Back Pain   Leg Pain    Logan Olson is a 88 y.o. male.  HPI Patient presents with his wife.  He arrives 2 days after being seen and evaluated overnight, being diagnosed with sciatica, and after following up with his physician as well.  He notes that in spite of steroids, oxycodone he continues to have pain from the right flank radiating down the right leg.  No abdominal pain, urinary complaints, fever.  He took his last dose of medication within the past 2 hours, but has yet to achieve relief. History is notable for prostate cancer in the past.     Home Medications Prior to Admission medications   Medication Sig Start Date End Date Taking? Authorizing Provider  Methyl Salicylate-Lido-Menthol 4-4-5 % PTCH Apply 1 patch topically in the morning and at bedtime. 05/18/22  Yes Carmin Muskrat, MD  predniSONE (DELTASONE) 20 MG tablet Take 2 tablets (40 mg total) by mouth daily with breakfast. For the next four days 05/18/22  Yes Carmin Muskrat, MD  amLODipine (NORVASC) 5 MG tablet  11/18/17   [provider]  ciprofloxacin (CIPRO) 500 MG tablet Take 1 tablet (500 mg total) by mouth 2 (two) times daily. 11/05/19   Tyler Pita, MD  hydrochlorothiazide (HYDRODIURIL) 25 MG tablet Take 25 mg by mouth daily. 11/01/19   [provider]  losartan (COZAAR) 100 MG tablet Take 100 mg by mouth daily. 11/01/19   [provider]  losartan-hydrochlorothiazide (HYZAAR) 100-12.5 MG tablet Take by mouth.    [provider]  oxyCODONE (ROXICODONE) 5 MG immediate release tablet Take 1 tablet (5 mg total) by mouth every 4 (four) hours as needed for severe pain. 05/15/22   Maudie Flakes, MD  phenazopyridine (PYRIDIUM) 200 MG tablet Take 1 tablet (200 mg total) by mouth 3 (three) times daily as needed for pain.  11/18/19   Tyler Pita, MD  predniSONE (DELTASONE) 20 MG tablet Take 2 tablets (40 mg total) by mouth daily for 4 days. 05/15/22 05/19/22  Maudie Flakes, MD  tamsulosin (FLOMAX) 0.4 MG CAPS capsule Take 1 capsule (0.4 mg total) by mouth daily after supper. 11/18/19   Tyler Pita, MD  TELMISARTAN PO     [provider]      Allergies    Patient has no known allergies.    Review of Systems   Review of Systems  All other systems reviewed and are negative.   Physical Exam Updated Vital Signs BP 123/67   Pulse (!) 44   Temp 97.8 F (36.6 C) (Oral)   Resp 15   Ht '6\' 2"'$  (1.88 m)   Wt 114.8 kg   SpO2 93%   BMI 32.48 kg/m  Physical Exam Vitals and nursing note reviewed.  Constitutional:      General: He is not in acute distress.    Appearance: He is well-developed.  HENT:     Head: Normocephalic and atraumatic.  Eyes:     Conjunctiva/sclera: Conjunctivae normal.  Cardiovascular:     Rate and Rhythm: Normal rate and regular rhythm.  Pulmonary:     Effort: Pulmonary effort is normal. No respiratory distress.     Breath sounds: No stridor.  Abdominal:     General: There is no distension.     Tenderness: There is right CVA  tenderness.  Musculoskeletal:     Comments: No appreciable deformities in the lower extremities.  Skin:    General: Skin is warm and dry.  Neurological:     Mental Status: He is alert and oriented to person, place, and time.     Comments: No focal deficits patient flexes each hip independently, and to command, moves both lower extremities similarly spontaneously.     ED Results / Procedures / Treatments   Labs (all labs ordered are listed, but only abnormal results are displayed) Labs Reviewed  URINALYSIS, ROUTINE W REFLEX MICROSCOPIC    EKG None  Radiology No results found.  Procedures Procedures    Medications Ordered in ED Medications  diclofenac (FLECTOR) 1.3 % 1 patch (1 patch Transdermal Patch Applied 05/18/22 0952)   morphine (PF) 4 MG/ML injection 4 mg (4 mg Intravenous Given 05/18/22 0950)  sodium chloride 0.9 % bolus 500 mL (0 mLs Intravenous Stopped 05/18/22 1020)    ED Course/ Medical Decision Making/ A&P  This patient with a Hx of prostate cancer, recent diagnosis of sciatica presents to the ED for concern of flank pain radiating down the right leg, this involves an extensive number of treatment options, and is a complaint that carries with it a high risk of complications and morbidity.    The differential diagnosis includes uncontrolled sciatica with pain, kidney stone, with history of prostate cancer, recurrence versus osseous involvement also consider   Social Determinants of Health:  Advanced age  Additional history obtained:  Additional history and/or information obtained from wife at bedside and chart review, notable for wife, HPI, chart review also notable for prostate cancer and the history with prior imaging to exclude osseous metastatic disease reviewed    After the initial evaluation, orders, including: CT labs urine analgesics were initiated.   Patient placed on Cardiac and Pulse-Oximetry Monitors. The patient was maintained on a cardiac monitor.  The cardiac monitored showed an rhythm of 50 sinus normal bradycardia noted The patient was also maintained on pulse oximetry. The readings were typically 100% room air normal   On repeat evaluation of the patient improved 11:51 AM Patient is moving his leg much more freely, pain is tolerable Lab Tests:  I personally interpreted labs.  The pertinent results include: No urinary tract infection  Imaging Studies ordered:  I independently visualized and interpreted imaging which showed disc bulge, L3, L4 I agree with the radiologist interpretation   Dispostion / Final MDM:  After consideration of the diagnostic results and the patient's response to treatment, he has improved substantially, has no evidence for CNS dysfunction, CT,  physical exam, history all consistent with lumbar spine radicular pathology.  Patient had adjustment of his medications, and as above, with his improvement here, is appropriate for close outpatient follow-up.  Multiple resources provided.   Final Clinical Impression(s) / ED Diagnoses Final diagnoses:  Bulge of lumbar disc without myelopathy  Herniated intervertebral disc of lumbar spine    Rx / DC Orders ED Discharge Orders          Ordered    predniSONE (DELTASONE) 20 MG tablet  Daily with breakfast        05/18/22 2831    Methyl Salicylate-Lido-Menthol 4-4-5 % PTCH  2 times daily        05/18/22 1151              Carmin Muskrat, MD 05/18/22 1152

## 2022-05-24 ENCOUNTER — Other Ambulatory Visit: Payer: Self-pay

## 2022-05-24 ENCOUNTER — Emergency Department (HOSPITAL_COMMUNITY): Payer: Medicare Other

## 2022-05-24 ENCOUNTER — Emergency Department (HOSPITAL_COMMUNITY)
Admission: EM | Admit: 2022-05-24 | Discharge: 2022-05-24 | Disposition: A | Discharge status: Home / Self Care | Payer: Medicare Other | Attending: Student | Admitting: Student

## 2022-05-24 ENCOUNTER — Encounter (HOSPITAL_COMMUNITY): Payer: Self-pay | Admitting: Emergency Medicine

## 2022-05-24 DIAGNOSIS — Z8546 Personal history of malignant neoplasm of prostate: Secondary | ICD-10-CM | POA: Insufficient documentation

## 2022-05-24 DIAGNOSIS — A419 Sepsis, unspecified organism: Secondary | ICD-10-CM | POA: Diagnosis not present

## 2022-05-24 DIAGNOSIS — I959 Hypotension, unspecified: Secondary | ICD-10-CM | POA: Diagnosis not present

## 2022-05-24 DIAGNOSIS — E860 Dehydration: Secondary | ICD-10-CM | POA: Insufficient documentation

## 2022-05-24 DIAGNOSIS — K5900 Constipation, unspecified: Secondary | ICD-10-CM | POA: Insufficient documentation

## 2022-05-24 DIAGNOSIS — M549 Dorsalgia, unspecified: Secondary | ICD-10-CM | POA: Diagnosis not present

## 2022-05-24 DIAGNOSIS — I10 Essential (primary) hypertension: Secondary | ICD-10-CM | POA: Insufficient documentation

## 2022-05-24 DIAGNOSIS — R55 Syncope and collapse: Secondary | ICD-10-CM | POA: Diagnosis present

## 2022-05-24 LAB — URINALYSIS, ROUTINE W REFLEX MICROSCOPIC
Bilirubin Urine: NEGATIVE
Glucose, UA: NEGATIVE mg/dL
Hgb urine dipstick: NEGATIVE
Ketones, ur: NEGATIVE mg/dL
Leukocytes,Ua: NEGATIVE
Nitrite: NEGATIVE
Protein, ur: NEGATIVE mg/dL
Specific Gravity, Urine: 1.017 (ref 1.005–1.030)
pH: 5 (ref 5.0–8.0)

## 2022-05-24 LAB — CBC WITH DIFFERENTIAL/PLATELET
Abs Immature Granulocytes: 0.13 10*3/uL — ABNORMAL HIGH (ref 0.00–0.07)
Basophils Absolute: 0 10*3/uL (ref 0.0–0.1)
Basophils Relative: 0 %
Eosinophils Absolute: 0.2 10*3/uL (ref 0.0–0.5)
Eosinophils Relative: 2 %
HCT: 38.9 % — ABNORMAL LOW (ref 39.0–52.0)
Hemoglobin: 13.3 g/dL (ref 13.0–17.0)
Immature Granulocytes: 2 %
Lymphocytes Relative: 15 %
Lymphs Abs: 1.1 10*3/uL (ref 0.7–4.0)
MCH: 34.5 pg — ABNORMAL HIGH (ref 26.0–34.0)
MCHC: 34.2 g/dL (ref 30.0–36.0)
MCV: 101 fL — ABNORMAL HIGH (ref 80.0–100.0)
Monocytes Absolute: 0.7 10*3/uL (ref 0.1–1.0)
Monocytes Relative: 10 %
Neutro Abs: 5.2 10*3/uL (ref 1.7–7.7)
Neutrophils Relative %: 71 %
Platelets: 167 10*3/uL (ref 150–400)
RBC: 3.85 MIL/uL — ABNORMAL LOW (ref 4.22–5.81)
RDW: 13.2 % (ref 11.5–15.5)
WBC: 7.3 10*3/uL (ref 4.0–10.5)
nRBC: 0 % (ref 0.0–0.2)

## 2022-05-24 LAB — COMPREHENSIVE METABOLIC PANEL
ALT: 47 U/L — ABNORMAL HIGH (ref 0–44)
AST: 23 U/L (ref 15–41)
Albumin: 3.9 g/dL (ref 3.5–5.0)
Alkaline Phosphatase: 38 U/L (ref 38–126)
Anion gap: 9 (ref 5–15)
BUN: 48 mg/dL — ABNORMAL HIGH (ref 8–23)
CO2: 23 mmol/L (ref 22–32)
Calcium: 8.8 mg/dL — ABNORMAL LOW (ref 8.9–10.3)
Chloride: 104 mmol/L (ref 98–111)
Creatinine, Ser: 2.32 mg/dL — ABNORMAL HIGH (ref 0.61–1.24)
GFR, Estimated: 27 mL/min — ABNORMAL LOW (ref >60)
Glucose, Bld: 106 mg/dL — ABNORMAL HIGH (ref 70–99)
Potassium: 3.1 mmol/L — ABNORMAL LOW (ref 3.5–5.1)
Sodium: 136 mmol/L (ref 135–145)
Total Bilirubin: 3.1 mg/dL — ABNORMAL HIGH (ref 0.3–1.2)
Total Protein: 6.7 g/dL (ref 6.5–8.1)

## 2022-05-24 LAB — APTT: aPTT: 23 seconds — ABNORMAL LOW (ref 24–36)

## 2022-05-24 LAB — PROTIME-INR
INR: 1.1 (ref 0.8–1.2)
Prothrombin Time: 13.7 seconds (ref 11.4–15.2)

## 2022-05-24 LAB — BASIC METABOLIC PANEL
Anion gap: 8 (ref 5–15)
BUN: 44 mg/dL — ABNORMAL HIGH (ref 8–23)
CO2: 25 mmol/L (ref 22–32)
Calcium: 8.4 mg/dL — ABNORMAL LOW (ref 8.9–10.3)
Chloride: 105 mmol/L (ref 98–111)
Creatinine, Ser: 2 mg/dL — ABNORMAL HIGH (ref 0.61–1.24)
GFR, Estimated: 32 mL/min — ABNORMAL LOW (ref >60)
Glucose, Bld: 117 mg/dL — ABNORMAL HIGH (ref 70–99)
Potassium: 3 mmol/L — ABNORMAL LOW (ref 3.5–5.1)
Sodium: 138 mmol/L (ref 135–145)

## 2022-05-24 LAB — LACTIC ACID, PLASMA: Lactic Acid, Venous: 0.9 mmol/L (ref 0.5–1.9)

## 2022-05-24 MED ORDER — LACTULOSE 10 GM/15ML PO SOLN
30.0000 g | Freq: Once | ORAL | Status: AC
Start: 2022-05-24 — End: 2022-05-24
  Administered 2022-05-24: 30 g via ORAL
  Filled 2022-05-24: qty 60

## 2022-05-24 MED ORDER — LACTATED RINGERS IV BOLUS
1000.0000 mL | Freq: Once | INTRAVENOUS | Status: AC
  Administered 2022-05-24: 1000 mL via INTRAVENOUS

## 2022-05-24 MED ORDER — POLYETHYLENE GLYCOL 3350 17 G PO PACK: 17.0000 g | PACK | Freq: Every day | ORAL | 0 refills | Status: AC

## 2022-05-24 MED ORDER — GABAPENTIN 100 MG PO CAPS
200.0000 mg | ORAL_CAPSULE | Freq: Once | ORAL | Status: AC
  Administered 2022-05-24: 200 mg via ORAL
  Filled 2022-05-24: qty 2

## 2022-05-24 MED ORDER — GLYCERIN (LAXATIVE) 1 G RE SUPP: 1.0000 | RECTAL | Status: DC | PRN

## 2022-05-24 MED ORDER — MAGNESIUM OXIDE -MG SUPPLEMENT 400 (240 MG) MG PO TABS
800.0000 mg | ORAL_TABLET | Freq: Once | ORAL | Status: AC
  Administered 2022-05-24: 800 mg via ORAL
  Filled 2022-05-24: qty 2

## 2022-05-24 MED ORDER — POTASSIUM CHLORIDE CRYS ER 20 MEQ PO TBCR
40.0000 meq | EXTENDED_RELEASE_TABLET | Freq: Once | ORAL | Status: AC
  Administered 2022-05-24: 40 meq via ORAL
  Filled 2022-05-24: qty 2

## 2022-05-24 NOTE — ED Provider Notes (Signed)
[facility] EMERGENCY DEPARTMENT Provider Note  CSN: [id number] Arrival date & time: 05/24/22 1714  Chief Complaint(s) Hypotension  HPI Logan Olson is a 88 y.o. male with PMH prostate cancer, HTN who presents emergency department for evaluation of low blood pressure.  Patient was evaluated here on 05/18/2022 for worsening low back pain where he received his stone study that showed a bulging herniated disc at L4-L5 with narrowing of the spinal canal.  He was placed on prednisone and oxycodone and patient has a follow-up appointment in 6 days with neurosurgery.  Patient states that he is having worsening constipation and is eating and drinking less secondary to decreased appetite and a feeling of abdominal fullness.  He states that he he had a presyncopal event at home and took his blood pressure, found it to be in the 70s.  He denies fever, chest pain, shortness of breath, headache, nausea, vomiting or other systemic symptoms.  Patient arrives hypotensive with blood pressure 85/57.   Past Medical History Past Medical History:  Diagnosis Date   Hypertension    Prostate cancer ([place]) dx'd 07/13/18   Patient Active Problem List   Diagnosis Date Noted   Malignant neoplasm of prostate ([place]) 09/24/2019   Elevated prostate specific antigen (PSA) 01/14/2018   Home Medication(s) Prior to Admission medications   Medication Sig Start Date End Date Taking? Authorizing Provider  amLODipine (NORVASC) 5 MG tablet Take 5 mg by mouth daily. 11/18/17  Yes [provider]  baclofen (LIORESAL) 10 MG tablet Take 10 mg by mouth 2 (two) times daily. 05/13/22  Yes [provider]  hydrochlorothiazide (HYDRODIURIL) 25 MG tablet Take 25 mg by mouth daily. 11/01/19  Yes [provider]  ibuprofen (ADVIL) 800 MG tablet Take 800 mg by mouth every 8 (eight) hours as needed for moderate pain.   Yes [provider]  losartan (COZAAR) 100 MG tablet Take 100 mg by mouth daily. 11/01/19   Yes [provider]  Methyl Salicylate-Lido-Menthol 4-4-5 % PTCH Apply 1 patch topically in the morning and at bedtime. 05/18/22  Yes Carmin Muskrat, MD  oxyCODONE (ROXICODONE) 5 MG immediate release tablet Take 1 tablet (5 mg total) by mouth every 4 (four) hours as needed for severe pain. 05/15/22  Yes Maudie Flakes, MD  polyethylene glycol (MIRALAX) 17 g packet Take 17 g by mouth daily. 05/24/22  Yes Zayd Bonet, MD  predniSONE (DELTASONE) 20 MG tablet Take 2 tablets (40 mg total) by mouth daily with breakfast. For the next four days Patient not taking: Reported on 05/24/2022 05/18/22   Carmin Muskrat, MD                                                                                                                                    Past Surgical History Past Surgical History:  Procedure Laterality Date   PROSTATE BIOPSY     Family History Family History  Problem  Relation Age of Onset   Breast cancer Sister    Colon cancer Neg Hx    Pancreatic cancer Neg Hx    Prostate cancer Neg Hx     Social History Social History   Tobacco Use   Smoking status: Never   Smokeless tobacco: Never  Vaping Use   Vaping Use: Never used  Substance Use Topics   Alcohol use: Never   Drug use: Never   Allergies Patient has no known allergies.  Review of Systems Review of Systems  Gastrointestinal:  Positive for abdominal distention and constipation.  Musculoskeletal:  Positive for back pain.  Neurological:  Positive for light-headedness.    Physical Exam Vital Signs  I have reviewed the triage vital signs BP 107/67   Pulse 61   Temp 98.1 F (36.7 C) (Oral)   Resp 17   SpO2 98%   Physical Exam Constitutional:      General: He is not in acute distress.    Appearance: Normal appearance.  HENT:     Head: Normocephalic and atraumatic.     Nose: No congestion or rhinorrhea.  Eyes:     General:        Right eye: No discharge.        Left eye: No discharge.      Extraocular Movements: Extraocular movements intact.     Pupils: Pupils are equal, round, and reactive to light.  Cardiovascular:     Rate and Rhythm: Normal rate and regular rhythm.     Heart sounds: No murmur heard. Pulmonary:     Effort: No respiratory distress.     Breath sounds: No wheezing or rales.  Abdominal:     General: There is distension.     Tenderness: There is no abdominal tenderness.  Musculoskeletal:        General: Tenderness present. Normal range of motion.     Cervical back: Normal range of motion.  Skin:    General: Skin is warm and dry.  Neurological:     General: No focal deficit present.     Mental Status: He is alert.     ED Results and Treatments Labs (all labs ordered are listed, but only abnormal results are displayed) Labs Reviewed  COMPREHENSIVE METABOLIC PANEL - Abnormal; Notable for the following components:      Result Value   Potassium 3.1 (*)    Glucose, Bld 106 (*)    BUN 48 (*)    Creatinine, Ser 2.32 (*)    Calcium 8.8 (*)    ALT 47 (*)    Total Bilirubin 3.1 (*)    GFR, Estimated 27 (*)    All other components within normal limits  CBC WITH DIFFERENTIAL/PLATELET - Abnormal; Notable for the following components:   RBC 3.85 (*)    HCT 38.9 (*)    MCV 101.0 (*)    MCH 34.5 (*)    Abs Immature Granulocytes 0.13 (*)    All other components within normal limits  APTT - Abnormal; Notable for the following components:   aPTT 23 (*)    All other components within normal limits  BASIC METABOLIC PANEL - Abnormal; Notable for the following components:   Potassium 3.0 (*)    Glucose, Bld 117 (*)    BUN 44 (*)    Creatinine, Ser 2.00 (*)    Calcium 8.4 (*)    GFR, Estimated 32 (*)    All other components within normal limits  CULTURE, BLOOD (ROUTINE X 2)  CULTURE, BLOOD (ROUTINE X 2)  URINE CULTURE  LACTIC ACID, PLASMA  PROTIME-INR  URINALYSIS, ROUTINE W REFLEX MICROSCOPIC                                                                                                                           Radiology DG Chest Port 1 View  Result Date: 05/24/2022 CLINICAL DATA:  Sepsis, hypotension EXAM: PORTABLE CHEST 1 VIEW COMPARISON:  None Available. FINDINGS: The heart size and mediastinal contours are within normal limits. Both lungs are clear. The visualized skeletal structures are unremarkable. IMPRESSION: No active disease. Electronically Signed   By: Randa Ngo M.D.   On: 05/24/2022 18:51    Pertinent labs & imaging results that were available during my care of the patient were reviewed by me and considered in my medical decision making (see MDM for details).  Medications Ordered in ED Medications  glycerin (Pediatric) 1 g suppository 1 g (has no administration in time range)  lactated ringers bolus 1,000 mL (0 mLs Intravenous Stopped 05/24/22 2244)  lactulose (CHRONULAC) 10 GM/15ML solution 30 g (30 g Oral Given 05/24/22 2004)  lactated ringers bolus 1,000 mL (0 mLs Intravenous Stopped 05/24/22 2009)  potassium chloride SA (KLOR-CON M) CR tablet 40 mEq (40 mEq Oral Given 05/24/22 2258)  magnesium oxide (MAG-OX) tablet 800 mg (800 mg Oral Given 05/24/22 2257)  gabapentin (NEURONTIN) capsule 200 mg (200 mg Oral Given 05/24/22 2259)                                                                                                                                     Procedures .Critical Care  Performed by: Teressa Lower, MD Authorized by: Teressa Lower, MD   Critical care provider statement:    Critical care time (minutes):  30   Critical care was necessary to treat or prevent imminent or life-threatening deterioration of the following conditions:  Dehydration and circulatory failure   Critical care was time spent personally by me on the following activities:  Development of treatment plan with patient or surrogate, discussions with consultants, evaluation of patient's response to treatment, examination of patient, ordering and  review of laboratory studies, ordering and review of radiographic studies, ordering and performing treatments and interventions, pulse oximetry, re-evaluation of patient's condition and review of old charts   (including critical care time)  Medical Decision Making / ED Course  This patient presents to the ED for concern of hypertension, this involves an extensive number of treatment options, and is a complaint that carries with it a high risk of complications and morbidity.  The differential diagnosis includes polypharmacy, decreased p.o. intake and associated dehydration, hypovolemic shock, septic shock  MDM: Patient seen emergency room for evaluation of hypotension.  Physical exam largely unremarkable.  Initial sepsis work-up with no significant leukocytosis, hypokalemia to 3.1 which was repleted here in the ER.  Creatinine elevated to 2.32 with a BUN of 48 and unfortunately I do not have any labs in the patient within the last 2 years.  I attempted to call the patient's PCP but his office is closed and there is apparently no way to reach a provider outside of his standard office hours.  Thus, patient received 2 L lactated Ringer's and blood pressure improved and a repeat BMP was performed that shows a creatinine of 2.0 and an improved GFR from 27 to 32.  Suspect patient's presentation is multifactorial with opioid use in combination with decreased p.o. intake secondary to opioid-induced constipation.  Patient was given lactulose here in the ER and was able to have multiple bowel movements.  Patient then discharged with MiraLAX and instructions to follow-up closely outpatient with his PCP for repeat creatinine testing.  Patient then discharged.   Additional history obtained: -Additional history obtained from wife -External records from outside source obtained and reviewed including: Chart review including previous notes, labs, imaging, consultation notes   Lab Tests: -I ordered, reviewed, and  interpreted labs.   The pertinent results include:   Labs Reviewed  COMPREHENSIVE METABOLIC PANEL - Abnormal; Notable for the following components:      Result Value   Potassium 3.1 (*)    Glucose, Bld 106 (*)    BUN 48 (*)    Creatinine, Ser 2.32 (*)    Calcium 8.8 (*)    ALT 47 (*)    Total Bilirubin 3.1 (*)    GFR, Estimated 27 (*)    All other components within normal limits  CBC WITH DIFFERENTIAL/PLATELET - Abnormal; Notable for the following components:   RBC 3.85 (*)    HCT 38.9 (*)    MCV 101.0 (*)    MCH 34.5 (*)    Abs Immature Granulocytes 0.13 (*)    All other components within normal limits  APTT - Abnormal; Notable for the following components:   aPTT 23 (*)    All other components within normal limits  BASIC METABOLIC PANEL - Abnormal; Notable for the following components:   Potassium 3.0 (*)    Glucose, Bld 117 (*)    BUN 44 (*)    Creatinine, Ser 2.00 (*)    Calcium 8.4 (*)    GFR, Estimated 32 (*)    All other components within normal limits  CULTURE, BLOOD (ROUTINE X 2)  CULTURE, BLOOD (ROUTINE X 2)  URINE CULTURE  LACTIC ACID, PLASMA  PROTIME-INR  URINALYSIS, ROUTINE W REFLEX MICROSCOPIC      EKG   EKG Interpretation  Date/Time:  Friday May 24 2022 18:26:55 EDT Ventricular Rate:  65 PR Interval:  247 QRS Duration: 147 QT Interval:  442 QTC Calculation: 460 R Axis:   -74 Text Interpretation: Sinus rhythm Prolonged PR interval RBBB and LAFB Confirmed by [place] ([id number]) on 05/25/2022 12:37:54 AM         Imaging Studies ordered: I ordered imaging studies including chest x-ray I independently visualized and interpreted imaging. I  agree with the radiologist interpretation   Medicines ordered and prescription drug management: Meds ordered this encounter  Medications   lactated ringers bolus 1,000 mL   lactulose (CHRONULAC) 10 GM/15ML solution 30 g   glycerin (Pediatric) 1 g suppository 1 g   lactated ringers bolus 1,000 mL    potassium chloride SA (KLOR-CON M) CR tablet 40 mEq   magnesium oxide (MAG-OX) tablet 800 mg   polyethylene glycol (MIRALAX) 17 g packet    Sig: Take 17 g by mouth daily.    Dispense:  14 each    Refill:  0   gabapentin (NEURONTIN) capsule 200 mg    -I have reviewed the patients home medicines and have made adjustments as needed  Critical interventions Aggressive fluid resuscitation, correction of hypotension   Cardiac Monitoring: The patient was maintained on a cardiac monitor.  I personally viewed and interpreted the cardiac monitored which showed an underlying rhythm of: NSR  Social Determinants of Health:  Factors impacting patients care include: none   Reevaluation: After the interventions noted above, I reevaluated the patient and found that they have :improved  Co morbidities that complicate the patient evaluation  Past Medical History:  Diagnosis Date   Hypertension    Prostate cancer ([place]) dx'd 07/13/18      Dispostion: I considered admission for this patient, and given resolved hypotension and improving creatinine with successful bowel movement here in the ER.  Patient discharged with outpatient follow-up.     Final Clinical Impression(s) / ED Diagnoses Final diagnoses:  Dehydration  Constipation, unspecified constipation type     '@PCDICTATION'$ @    Teressa Lower, MD 05/25/22 [phone]

## 2022-05-24 NOTE — ED Notes (Signed)
Introduced self to patient and provided urinal. Pt states that he will try to provide sample.

## 2022-05-24 NOTE — ED Triage Notes (Signed)
Pt here the other day and states thinks meds that were given has caused him to have low bp. Pt c/o headache, tired and bp was low today. A/o. Nad at this time.

## 2022-05-26 LAB — URINE CULTURE: Culture: 200 — AB

## 2022-05-27 ENCOUNTER — Telehealth (HOSPITAL_BASED_OUTPATIENT_CLINIC_OR_DEPARTMENT_OTHER): Payer: Self-pay | Admitting: *Deleted

## 2022-05-27 DIAGNOSIS — N178 Other acute kidney failure: Secondary | ICD-10-CM | POA: Diagnosis not present

## 2022-05-27 DIAGNOSIS — Z79899 Other long term (current) drug therapy: Secondary | ICD-10-CM | POA: Diagnosis not present

## 2022-05-27 NOTE — Progress Notes (Signed)
ED Antimicrobial Stewardship Positive Culture Follow Up   Logan Olson is an 88 y.o. male who presented to [facility] on 05/24/2022 with a chief complaint of  Chief Complaint  Patient presents with   Hypotension    Recent Results (from the past 720 hour(s))  Urine Culture     Status: Abnormal   Collection Time: 05/24/22  5:36 PM   Specimen: In/Out Cath Urine  Result Value Ref Range Status   Specimen Description   Final    IN/OUT CATH URINE Performed at [facility], [address], [place], [place] [address]    Special Requests   Final    NONE Performed at [facility], [address] [place], [place] [address]    Culture (A)  Final    200 COLONIES/mL STAPHYLOCOCCUS EPIDERMIDIS CALL MICROBIOLOGY LAB IF SENSITIVITIES ARE REQUIRED. Performed at [facility], [place], [place], [place] [address]    Report Status 05/26/2022 FINAL  Final  Blood Culture (routine x 2)     Status: None (Preliminary result)   Collection Time: 05/24/22  5:41 PM   Specimen: Right Antecubital; Blood  Result Value Ref Range Status   Specimen Description   Final    RIGHT ANTECUBITAL BOTTLES DRAWN AEROBIC AND ANAEROBIC   Special Requests Blood Culture adequate volume  Final   Culture   Final    NO GROWTH 3 DAYS Performed at [facility], [address], [place], [place] [address]    Report Status PENDING  Incomplete  Blood Culture (routine x 2)     Status: None (Preliminary result)   Collection Time: 05/24/22  6:21 PM   Specimen: BLOOD LEFT ARM  Result Value Ref Range Status   Specimen Description BLOOD LEFT ARM  Final   Special Requests   Final    BOTTLES DRAWN AEROBIC AND ANAEROBIC Blood Culture adequate volume   Culture   Final    NO GROWTH 3 DAYS Performed at [facility], [address], [place], [place] [address]    Report Status PENDING  Incomplete    '[x]'$  Patient discharged originally without antimicrobial agent  New antibiotic prescription: No further treatment indicated  for likely contaminant (low colony count of staph epi) and no urinary symptoms.  ED Provider: Godfrey Pick, MD   Margretta Sidle Dohlen 05/27/2022, 9:15 AM Clinical Pharmacist Monday - Friday phone -  [phone] Saturday - Sunday phone - [phone]

## 2022-05-27 NOTE — Telephone Encounter (Signed)
Post ED Visit - Positive Culture Follow-up: Successful Patient Follow-Up  Culture assessed and recommendations reviewed by:  '[x]'$  Mal Misty Dohlenr, Pharm.D. '[]'$  Heide Guile, Pharm.D., BCPS AQ-ID '[]'$  Parks Neptune, Pharm.D., BCPS '[]'$  Alycia Rossetti, Pharm.D., BCPS '[]'$  [place], Pharm.D., BCPS, AAHIVP '[]'$  Legrand Como, Pharm.D., BCPS, AAHIVP '[]'$  Salome Arnt, PharmD, BCPS '[]'$  Johnnette Gourd, PharmD, BCPS '[]'$  Hughes Better, PharmD, BCPS '[]'$  Leeroy Cha, PharmD  Positive urine culture  '[x]'$  Patient discharged without antimicrobial prescription  '[]'$  Organism is resistant to prescribed ED discharge antimicrobial '[]'$  Patient with positive blood cultures  Changes discussed with ED provider: Godfrey Pick, MD New antibiotic prescription No further treatment indicated       Rosie Fate 05/27/2022, 12:22 PM

## 2022-05-28 ENCOUNTER — Emergency Department (HOSPITAL_COMMUNITY)
Admission: EM | Admit: 2022-05-28 | Discharge: 2022-05-28 | Disposition: A | Discharge status: Home / Self Care | Payer: Medicare Other | Attending: Emergency Medicine | Admitting: Emergency Medicine

## 2022-05-28 ENCOUNTER — Other Ambulatory Visit: Payer: Self-pay

## 2022-05-28 ENCOUNTER — Emergency Department (HOSPITAL_COMMUNITY): Payer: Medicare Other

## 2022-05-28 ENCOUNTER — Encounter (HOSPITAL_COMMUNITY): Payer: Self-pay

## 2022-05-28 DIAGNOSIS — M5441 Lumbago with sciatica, right side: Secondary | ICD-10-CM | POA: Diagnosis not present

## 2022-05-28 DIAGNOSIS — M5126 Other intervertebral disc displacement, lumbar region: Secondary | ICD-10-CM | POA: Diagnosis not present

## 2022-05-28 DIAGNOSIS — R448 Other symptoms and signs involving general sensations and perceptions: Secondary | ICD-10-CM | POA: Insufficient documentation

## 2022-05-28 DIAGNOSIS — R449 Unspecified symptoms and signs involving general sensations and perceptions: Secondary | ICD-10-CM

## 2022-05-28 DIAGNOSIS — M545 Low back pain, unspecified: Secondary | ICD-10-CM | POA: Diagnosis not present

## 2022-05-28 DIAGNOSIS — I10 Essential (primary) hypertension: Secondary | ICD-10-CM | POA: Insufficient documentation

## 2022-05-28 DIAGNOSIS — Z8546 Personal history of malignant neoplasm of prostate: Secondary | ICD-10-CM | POA: Insufficient documentation

## 2022-05-28 LAB — CBC
HCT: 37.2 % — ABNORMAL LOW (ref 39.0–52.0)
Hemoglobin: 12.8 g/dL — ABNORMAL LOW (ref 13.0–17.0)
MCH: 34.8 pg — ABNORMAL HIGH (ref 26.0–34.0)
MCHC: 34.4 g/dL (ref 30.0–36.0)
MCV: 101.1 fL — ABNORMAL HIGH (ref 80.0–100.0)
Platelets: 126 10*3/uL — ABNORMAL LOW (ref 150–400)
RBC: 3.68 MIL/uL — ABNORMAL LOW (ref 4.22–5.81)
RDW: 12.8 % (ref 11.5–15.5)
WBC: 5.4 10*3/uL (ref 4.0–10.5)
nRBC: 0 % (ref 0.0–0.2)

## 2022-05-28 LAB — BASIC METABOLIC PANEL
Anion gap: 5 (ref 5–15)
BUN: 29 mg/dL — ABNORMAL HIGH (ref 8–23)
CO2: 26 mmol/L (ref 22–32)
Calcium: 8.9 mg/dL (ref 8.9–10.3)
Chloride: 107 mmol/L (ref 98–111)
Creatinine, Ser: 1.26 mg/dL — ABNORMAL HIGH (ref 0.61–1.24)
GFR, Estimated: 56 mL/min — ABNORMAL LOW (ref >60)
Glucose, Bld: 102 mg/dL — ABNORMAL HIGH (ref 70–99)
Potassium: 3.3 mmol/L — ABNORMAL LOW (ref 3.5–5.1)
Sodium: 138 mmol/L (ref 135–145)

## 2022-05-28 MED ORDER — HYDROMORPHONE HCL 1 MG/ML IJ SOLN
1.0000 mg | Freq: Once | INTRAMUSCULAR | Status: AC
  Administered 2022-05-28: 1 mg via INTRAVENOUS
  Filled 2022-05-28: qty 1

## 2022-05-28 MED ORDER — MORPHINE SULFATE (PF) 4 MG/ML IV SOLN
4.0000 mg | Freq: Once | INTRAVENOUS | Status: AC
  Administered 2022-05-28: 4 mg via INTRAVENOUS
  Filled 2022-05-28: qty 1

## 2022-05-28 NOTE — ED Notes (Signed)
Water and crackers given to pt.

## 2022-05-28 NOTE — ED Triage Notes (Signed)
Lower back and shoots down right leg for approx 2 weeks.  Pt has appt with Neuro on Thursday - has been here 3-4 x recently for same.

## 2022-05-28 NOTE — Discharge Instructions (Addendum)
Follow-up with neurosurgery as scheduled for Thursday.  Take the oxycodone if you need for breakthrough pain you continue to use your lidocaine patches.  Rest is much as possible.  MRI consistent with L4-L5 right-sided herniated disc pressing on the nerve.  Which is the cause of your pain.

## 2022-05-28 NOTE — ED Provider Notes (Signed)
Patient more comfortable with the pain medicine.  Does have oxycodone at home but he does not like taking that.  Has follow-up with neurosurgery scheduled for Thursday.  MRI does show L4-L5 right-sided herniated disc with nerve root compression which explains his pain.  Patient not able to take prednisone.  So therapeutic measurements are little bit limited.  He is using the lidocaine patches.  Patient stable for discharge home and follow-up with neurosurgery scheduled for Thursday already.   Fredia Sorrow, MD 05/28/22 [phone]

## 2022-05-28 NOTE — ED Provider Notes (Signed)
[facility] Emergency Department Provider Note MRN:  [id number]  Arrival date & time: 05/28/22     Chief Complaint   Back Pain   History of Present Illness   Logan Olson is a 88 y.o. year-old male with a history of hypertension, prostate cancer presenting to the ED with chief complaint of back pain.  Continued pain to the lower back with radiation down the right leg.  Persistent for nearly 2 weeks, not getting better, pain is severe.  Worse with movement of the right leg.  Has decreased sensation to the right leg more recently.  Trouble walking due to pain.  No fever.  Review of Systems  A thorough review of systems was obtained and all systems are negative except as noted in the HPI and PMH.   Patient's Health History    Past Medical History:  Diagnosis Date   Hypertension    Prostate cancer ([place]) dx'd 07/13/18    Past Surgical History:  Procedure Laterality Date   PROSTATE BIOPSY      Family History  Problem Relation Age of Onset   Breast cancer Sister    Colon cancer Neg Hx    Pancreatic cancer Neg Hx    Prostate cancer Neg Hx     Social History   Socioeconomic History   Marital status: Married    Spouse name: Not on file   Number of children: Not on file   Years of education: Not on file   Highest education level: Not on file  Occupational History   Not on file  Tobacco Use   Smoking status: Never   Smokeless tobacco: Never  Vaping Use   Vaping Use: Never used  Substance and Sexual Activity   Alcohol use: Never   Drug use: Never   Sexual activity: Not Currently  Other Topics Concern   Not on file  Social History Narrative   Not on file   Social Determinants of Health   Financial Resource Strain: Not on file  Food Insecurity: Not on file  Transportation Needs: Not on file  Physical Activity: Not on file  Stress: Not on file  Social Connections: Not on file  Intimate Partner Violence: Not on file     Physical Exam    Vitals:   05/28/22 0430 05/28/22 0500  BP: 123/66 119/60  Pulse: (!) 50 (!) 45  Resp:    Temp:    SpO2: 98% 95%    CONSTITUTIONAL: Well-appearing, NAD NEURO/PSYCH:  Alert and oriented x 3, subjective decreased sensation to the right leg EYES:  eyes equal and reactive ENT/NECK:  no LAD, no JVD CARDIO: Bradycardic rate, well-perfused, normal S1 and S2 PULM:  CTAB no wheezing or rhonchi GI/GU:  non-distended, non-tender MSK/SPINE:  No gross deformities, no edema SKIN:  no rash, atraumatic   *Additional and/or pertinent findings included in MDM below  Diagnostic and Interventional Summary    EKG Interpretation  Date/Time:    Ventricular Rate:    PR Interval:    QRS Duration:   QT Interval:    QTC Calculation:   R Axis:     Text Interpretation:         Labs Reviewed  CBC - Abnormal; Notable for the following components:      Result Value   RBC 3.68 (*)    Hemoglobin 12.8 (*)    HCT 37.2 (*)    MCV 101.1 (*)    MCH 34.8 (*)    Platelets 126 (*)  All other components within normal limits  BASIC METABOLIC PANEL - Abnormal; Notable for the following components:   Potassium 3.3 (*)    Glucose, Bld 102 (*)    BUN 29 (*)    Creatinine, Ser 1.26 (*)    GFR, Estimated 56 (*)    All other components within normal limits    MR LUMBAR SPINE WO CONTRAST    (Results Pending)    Medications  morphine (PF) 4 MG/ML injection 4 mg (4 mg Intravenous Given 05/28/22 0426)     Procedures  /  Critical Care Procedures  ED Course and Medical Decision Making  Initial Impression and Ddx Concern for progressive neurological deficit, cauda equina, lumbar stenosis, will need MRI.  Past medical/surgical history that increases complexity of ED encounter: History of prostate cancer  Interpretation of Diagnostics I personally reviewed the laboratory assessment and my interpretation is as follows: No significant blood count or electrolyte disturbance, kidney function  improved    Patient Reassessment and Ultimate Disposition/Management     Awaiting MRI, signed out to oncoming provider.  Patient management required discussion with the following services or consulting groups:  None  Complexity of Problems Addressed Acute illness or injury that poses threat of life of bodily function  Additional Data Reviewed and Analyzed Further history obtained from: Further history from spouse/family member  Additional Factors Impacting ED Encounter Risk Consideration of hospitalization  Logan Kirks. Sedonia Small, MD [place] [email or web address]  Final Clinical Impressions(s) / ED Diagnoses     ICD-10-CM   1. Midline low back pain with right-sided sciatica, unspecified chronicity  M54.41     2. Sensory deficit present  R44.9       ED Discharge Orders     None        Discharge Instructions Discussed with and Provided to Patient:   Discharge Instructions   None      Maudie Flakes, MD 05/28/22 [phone]

## 2022-05-29 LAB — CULTURE, BLOOD (ROUTINE X 2)
Culture: NO GROWTH
Culture: NO GROWTH
Special Requests: ADEQUATE
Special Requests: ADEQUATE

## 2022-05-30 ENCOUNTER — Other Ambulatory Visit: Payer: Self-pay | Admitting: Neurosurgery

## 2022-05-30 ENCOUNTER — Encounter (HOSPITAL_COMMUNITY): Payer: Self-pay | Admitting: Neurosurgery

## 2022-05-30 ENCOUNTER — Other Ambulatory Visit: Payer: Self-pay

## 2022-05-30 DIAGNOSIS — M7130 Other bursal cyst, unspecified site: Secondary | ICD-10-CM | POA: Diagnosis not present

## 2022-05-30 DIAGNOSIS — M5126 Other intervertebral disc displacement, lumbar region: Secondary | ICD-10-CM | POA: Diagnosis not present

## 2022-05-30 DIAGNOSIS — Z6831 Body mass index (BMI) 31.0-31.9, adult: Secondary | ICD-10-CM | POA: Diagnosis not present

## 2022-05-30 NOTE — Progress Notes (Signed)
Spoke with pt's wife, Ivin Booty for pre-op call. Pt has hx of HTN, but no cardiac history or Diabetes.  Shower instructions given to Ivin Booty and she voiced understanding.

## 2022-05-31 ENCOUNTER — Ambulatory Visit (HOSPITAL_COMMUNITY): Payer: Medicare Other | Admitting: Physician Assistant

## 2022-05-31 ENCOUNTER — Ambulatory Visit (HOSPITAL_COMMUNITY): Payer: Medicare Other

## 2022-05-31 ENCOUNTER — Encounter (HOSPITAL_COMMUNITY): Payer: Self-pay | Admitting: Neurosurgery

## 2022-05-31 ENCOUNTER — Ambulatory Visit (HOSPITAL_COMMUNITY)
Admission: RE | Disposition: A | Discharge status: Home / Self Care | Payer: Self-pay | Source: Home / Self Care | Attending: Neurosurgery

## 2022-05-31 ENCOUNTER — Ambulatory Visit (HOSPITAL_BASED_OUTPATIENT_CLINIC_OR_DEPARTMENT_OTHER): Payer: Medicare Other | Admitting: Physician Assistant

## 2022-05-31 ENCOUNTER — Observation Stay (HOSPITAL_COMMUNITY)
Admission: RE | Admit: 2022-05-31 | Discharge: 2022-06-01 | Disposition: A | Discharge status: Home / Self Care | Payer: Medicare Other | Attending: Neurosurgery | Admitting: Neurosurgery

## 2022-05-31 DIAGNOSIS — M5126 Other intervertebral disc displacement, lumbar region: Secondary | ICD-10-CM | POA: Diagnosis not present

## 2022-05-31 DIAGNOSIS — M7138 Other bursal cyst, other site: Secondary | ICD-10-CM

## 2022-05-31 DIAGNOSIS — N289 Disorder of kidney and ureter, unspecified: Secondary | ICD-10-CM

## 2022-05-31 DIAGNOSIS — Z8546 Personal history of malignant neoplasm of prostate: Secondary | ICD-10-CM | POA: Insufficient documentation

## 2022-05-31 DIAGNOSIS — M5116 Intervertebral disc disorders with radiculopathy, lumbar region: Principal | ICD-10-CM | POA: Insufficient documentation

## 2022-05-31 DIAGNOSIS — Z79899 Other long term (current) drug therapy: Secondary | ICD-10-CM | POA: Diagnosis not present

## 2022-05-31 DIAGNOSIS — I10 Essential (primary) hypertension: Secondary | ICD-10-CM

## 2022-05-31 HISTORY — DX: Personal history of urinary calculi: Z87.442

## 2022-05-31 HISTORY — PX: LUMBAR LAMINECTOMY/DECOMPRESSION MICRODISCECTOMY: SHX5026

## 2022-05-31 HISTORY — DX: Unspecified osteoarthritis, unspecified site: M19.90

## 2022-05-31 LAB — SURGICAL PCR SCREEN
MRSA, PCR: NEGATIVE
Staphylococcus aureus: POSITIVE — AB

## 2022-05-31 SURGERY — LUMBAR LAMINECTOMY/DECOMPRESSION MICRODISCECTOMY 2 LEVELS
Anesthesia: General | Site: Spine Lumbar

## 2022-05-31 MED ORDER — CHLORHEXIDINE GLUCONATE 0.12 % MT SOLN
15.0000 mL | Freq: Once | OROMUCOSAL | Status: AC
Start: 2022-05-31 — End: 2022-05-31
  Administered 2022-05-31: 15 mL via OROMUCOSAL
  Filled 2022-05-31: qty 15

## 2022-05-31 MED ORDER — SODIUM CHLORIDE 0.9% FLUSH: 3.0000 mL | INTRAVENOUS | Status: DC | PRN

## 2022-05-31 MED ORDER — THROMBIN (RECOMBINANT) 5000 UNITS EX SOLR
CUTANEOUS | Status: DC | PRN
  Administered 2022-05-31: 10 mL via TOPICAL

## 2022-05-31 MED ORDER — EPHEDRINE 5 MG/ML INJ
INTRAVENOUS | Status: AC
  Filled 2022-05-31: qty 5

## 2022-05-31 MED ORDER — LOSARTAN POTASSIUM 50 MG PO TABS
100.0000 mg | ORAL_TABLET | Freq: Every day | ORAL | Status: DC
  Administered 2022-05-31: 100 mg via ORAL
  Filled 2022-05-31: qty 2

## 2022-05-31 MED ORDER — LIDOCAINE-EPINEPHRINE 0.5 %-1:200000 IJ SOLN
INTRAMUSCULAR | Status: AC
  Filled 2022-05-31: qty 1

## 2022-05-31 MED ORDER — ONDANSETRON HCL 4 MG PO TABS: 4.0000 mg | ORAL_TABLET | Freq: Four times a day (QID) | ORAL | Status: DC | PRN

## 2022-05-31 MED ORDER — FENTANYL CITRATE (PF) 250 MCG/5ML IJ SOLN
INTRAMUSCULAR | Status: DC | PRN
  Administered 2022-05-31 (×2): 50 ug via INTRAVENOUS

## 2022-05-31 MED ORDER — SODIUM CHLORIDE 0.9 % IV SOLN
250.0000 mL | INTRAVENOUS | Status: DC
  Administered 2022-05-31: 250 mL via INTRAVENOUS

## 2022-05-31 MED ORDER — ONDANSETRON HCL 4 MG/2ML IJ SOLN
INTRAMUSCULAR | Status: AC
  Filled 2022-05-31: qty 4

## 2022-05-31 MED ORDER — LACTATED RINGERS IV SOLN: INTRAVENOUS | Status: DC

## 2022-05-31 MED ORDER — LIDOCAINE 2% (20 MG/ML) 5 ML SYRINGE
INTRAMUSCULAR | Status: DC | PRN
  Administered 2022-05-31: 100 mg via INTRAVENOUS

## 2022-05-31 MED ORDER — SUGAMMADEX SODIUM 200 MG/2ML IV SOLN
INTRAVENOUS | Status: DC | PRN
  Administered 2022-05-31: 200 mg via INTRAVENOUS

## 2022-05-31 MED ORDER — ONDANSETRON HCL 4 MG/2ML IJ SOLN: 4.0000 mg | Freq: Four times a day (QID) | INTRAMUSCULAR | Status: DC | PRN

## 2022-05-31 MED ORDER — POTASSIUM CHLORIDE IN NACL 20-0.9 MEQ/L-% IV SOLN: INTRAVENOUS | Status: DC

## 2022-05-31 MED ORDER — OXYCODONE HCL 5 MG PO TABS: 5.0000 mg | ORAL_TABLET | Freq: Once | ORAL | Status: DC | PRN

## 2022-05-31 MED ORDER — ORAL CARE MOUTH RINSE: 15.0000 mL | Freq: Once | OROMUCOSAL | Status: AC

## 2022-05-31 MED ORDER — DEXAMETHASONE SODIUM PHOSPHATE 10 MG/ML IJ SOLN
INTRAMUSCULAR | Status: AC
  Filled 2022-05-31: qty 2

## 2022-05-31 MED ORDER — ONDANSETRON HCL 4 MG/2ML IJ SOLN: 4.0000 mg | Freq: Once | INTRAMUSCULAR | Status: DC | PRN

## 2022-05-31 MED ORDER — LIDOCAINE 2% (20 MG/ML) 5 ML SYRINGE
INTRAMUSCULAR | Status: AC
  Filled 2022-05-31: qty 10

## 2022-05-31 MED ORDER — MUPIROCIN 2 % EX OINT
1.0000 | TOPICAL_OINTMENT | Freq: Two times a day (BID) | CUTANEOUS | Status: DC
  Administered 2022-05-31 – 2022-06-01 (×2): 1 via NASAL
  Filled 2022-05-31 (×2): qty 22

## 2022-05-31 MED ORDER — DIAZEPAM 5 MG PO TABS
5.0000 mg | ORAL_TABLET | Freq: Four times a day (QID) | ORAL | Status: DC | PRN
  Administered 2022-05-31: 5 mg via ORAL
  Filled 2022-05-31: qty 1

## 2022-05-31 MED ORDER — PHENYLEPHRINE HCL-NACL 20-0.9 MG/250ML-% IV SOLN
INTRAVENOUS | Status: DC | PRN
  Administered 2022-05-31: 25 ug/min via INTRAVENOUS

## 2022-05-31 MED ORDER — ALUM & MAG HYDROXIDE-SIMETH 200-200-20 MG/5ML PO SUSP
30.0000 mL | Freq: Four times a day (QID) | ORAL | Status: DC | PRN
Start: 2022-05-31 — End: 2022-06-01

## 2022-05-31 MED ORDER — THROMBIN 5000 UNITS EX SOLR
CUTANEOUS | Status: AC
  Filled 2022-05-31: qty 10000

## 2022-05-31 MED ORDER — MORPHINE SULFATE (PF) 2 MG/ML IV SOLN: 2.0000 mg | INTRAVENOUS | Status: DC | PRN

## 2022-05-31 MED ORDER — ROCURONIUM BROMIDE 10 MG/ML (PF) SYRINGE
PREFILLED_SYRINGE | INTRAVENOUS | Status: DC | PRN
  Administered 2022-05-31: 70 mg via INTRAVENOUS

## 2022-05-31 MED ORDER — FENTANYL CITRATE (PF) 250 MCG/5ML IJ SOLN
INTRAMUSCULAR | Status: AC
  Filled 2022-05-31: qty 5

## 2022-05-31 MED ORDER — ONDANSETRON HCL 4 MG/2ML IJ SOLN
INTRAMUSCULAR | Status: DC | PRN
  Administered 2022-05-31: 4 mg via INTRAVENOUS

## 2022-05-31 MED ORDER — OXYCODONE HCL 5 MG/5ML PO SOLN: 5.0000 mg | Freq: Once | ORAL | Status: DC | PRN

## 2022-05-31 MED ORDER — CEFAZOLIN SODIUM-DEXTROSE 2-4 GM/100ML-% IV SOLN
INTRAVENOUS | Status: AC
  Filled 2022-05-31: qty 100

## 2022-05-31 MED ORDER — SODIUM CHLORIDE 0.9% FLUSH
3.0000 mL | Freq: Two times a day (BID) | INTRAVENOUS | Status: DC
Start: 2022-05-31 — End: 2022-06-01
  Administered 2022-05-31: 3 mL via INTRAVENOUS

## 2022-05-31 MED ORDER — CHLORHEXIDINE GLUCONATE CLOTH 2 % EX PADS
6.0000 | MEDICATED_PAD | Freq: Every day | CUTANEOUS | Status: DC
  Administered 2022-06-01: 6 via TOPICAL

## 2022-05-31 MED ORDER — PHENOL 1.4 % MT LIQD: 1.0000 | OROMUCOSAL | Status: DC | PRN

## 2022-05-31 MED ORDER — FENTANYL CITRATE (PF) 100 MCG/2ML IJ SOLN
25.0000 ug | INTRAMUSCULAR | Status: DC | PRN
  Administered 2022-05-31 (×2): 50 ug via INTRAVENOUS

## 2022-05-31 MED ORDER — ZOLPIDEM TARTRATE 5 MG PO TABS: 5.0000 mg | ORAL_TABLET | Freq: Every evening | ORAL | Status: DC | PRN

## 2022-05-31 MED ORDER — THROMBIN 5000 UNITS EX SOLR
CUTANEOUS | Status: AC
  Filled 2022-05-31: qty 5000

## 2022-05-31 MED ORDER — BUPIVACAINE HCL (PF) 0.5 % IJ SOLN
INTRAMUSCULAR | Status: AC
  Filled 2022-05-31: qty 30

## 2022-05-31 MED ORDER — PHENYLEPHRINE 80 MCG/ML (10ML) SYRINGE FOR IV PUSH (FOR BLOOD PRESSURE SUPPORT)
PREFILLED_SYRINGE | INTRAVENOUS | Status: AC
Start: 2022-05-31 — End: ?
  Filled 2022-05-31: qty 10

## 2022-05-31 MED ORDER — PROPOFOL 10 MG/ML IV BOLUS
INTRAVENOUS | Status: AC
  Filled 2022-05-31: qty 20

## 2022-05-31 MED ORDER — ACETAMINOPHEN 325 MG PO TABS
650.0000 mg | ORAL_TABLET | ORAL | Status: DC | PRN
  Administered 2022-05-31 – 2022-06-01 (×2): 650 mg via ORAL
  Filled 2022-05-31 (×2): qty 2

## 2022-05-31 MED ORDER — OXYCODONE HCL ER 10 MG PO T12A
10.0000 mg | EXTENDED_RELEASE_TABLET | Freq: Two times a day (BID) | ORAL | Status: DC
  Administered 2022-05-31: 10 mg via ORAL
  Filled 2022-05-31: qty 1

## 2022-05-31 MED ORDER — OXYCODONE HCL 5 MG PO TABS
10.0000 mg | ORAL_TABLET | ORAL | Status: DC | PRN
  Administered 2022-06-01: 10 mg via ORAL
  Filled 2022-05-31: qty 2

## 2022-05-31 MED ORDER — PROPOFOL 10 MG/ML IV BOLUS
INTRAVENOUS | Status: DC | PRN
  Administered 2022-05-31: 120 mg via INTRAVENOUS

## 2022-05-31 MED ORDER — FENTANYL CITRATE (PF) 100 MCG/2ML IJ SOLN
50.0000 ug | INTRAMUSCULAR | Status: AC | PRN
  Administered 2022-05-31 (×2): 50 ug via INTRAVENOUS
  Filled 2022-05-31: qty 2

## 2022-05-31 MED ORDER — FENTANYL CITRATE (PF) 100 MCG/2ML IJ SOLN
INTRAMUSCULAR | Status: AC
  Filled 2022-05-31: qty 2

## 2022-05-31 MED ORDER — DEXAMETHASONE SODIUM PHOSPHATE 10 MG/ML IJ SOLN
INTRAMUSCULAR | Status: DC | PRN
  Administered 2022-05-31: 5 mg via INTRAVENOUS

## 2022-05-31 MED ORDER — CEFAZOLIN SODIUM-DEXTROSE 2-4 GM/100ML-% IV SOLN
2.0000 g | Freq: Once | INTRAVENOUS | Status: AC
  Administered 2022-05-31: 2 g via INTRAVENOUS

## 2022-05-31 MED ORDER — HEPARIN SODIUM (PORCINE) 5000 UNIT/ML IJ SOLN
5000.0000 [IU] | Freq: Three times a day (TID) | INTRAMUSCULAR | Status: DC
  Administered 2022-05-31 – 2022-06-01 (×2): 5000 [IU] via SUBCUTANEOUS
  Filled 2022-05-31 (×2): qty 1

## 2022-05-31 MED ORDER — OXYCODONE HCL 5 MG PO TABS
5.0000 mg | ORAL_TABLET | ORAL | Status: DC | PRN
  Administered 2022-05-31: 5 mg via ORAL
  Filled 2022-05-31: qty 1

## 2022-05-31 MED ORDER — AMLODIPINE BESYLATE 5 MG PO TABS
5.0000 mg | ORAL_TABLET | Freq: Every day | ORAL | Status: DC
  Filled 2022-05-31: qty 1

## 2022-05-31 MED ORDER — ROCURONIUM BROMIDE 10 MG/ML (PF) SYRINGE
PREFILLED_SYRINGE | INTRAVENOUS | Status: AC
Start: 2022-05-31 — End: ?
  Filled 2022-05-31: qty 20

## 2022-05-31 MED ORDER — BUPIVACAINE HCL 0.5 % IJ SOLN
INTRAMUSCULAR | Status: DC | PRN
  Administered 2022-05-31: 20 mL

## 2022-05-31 MED ORDER — MENTHOL 3 MG MT LOZG: 1.0000 | LOZENGE | OROMUCOSAL | Status: DC | PRN

## 2022-05-31 MED ORDER — 0.9 % SODIUM CHLORIDE (POUR BTL) OPTIME
TOPICAL | Status: DC | PRN
  Administered 2022-05-31: 1000 mL

## 2022-05-31 MED ORDER — LIDOCAINE-EPINEPHRINE 0.5 %-1:200000 IJ SOLN
INTRAMUSCULAR | Status: DC | PRN
  Administered 2022-05-31: 10 mL

## 2022-05-31 MED ORDER — ACETAMINOPHEN 650 MG RE SUPP: 650.0000 mg | RECTAL | Status: DC | PRN

## 2022-05-31 MED ORDER — THROMBIN 5000 UNITS EX SOLR
OROMUCOSAL | Status: DC | PRN
  Administered 2022-05-31: 5 mL via TOPICAL

## 2022-05-31 MED ORDER — POLYETHYLENE GLYCOL 3350 17 G PO PACK
17.0000 g | PACK | Freq: Every day | ORAL | Status: DC
  Administered 2022-05-31: 17 g via ORAL
  Filled 2022-05-31: qty 1

## 2022-05-31 SURGICAL SUPPLY — 53 items
ADH SKN CLS APL DERMABOND .7 (GAUZE/BANDAGES/DRESSINGS) ×1
APL SKNCLS STERI-STRIP NONHPOA (GAUZE/BANDAGES/DRESSINGS)
BAG COUNTER SPONGE SURGICOUNT (BAG) ×3 IMPLANT
BAG SPNG CNTER NS LX DISP (BAG) ×2
BAND INSRT 18 STRL LF DISP RB (MISCELLANEOUS) ×2
BAND RUBBER #18 3X1/16 STRL (MISCELLANEOUS) ×4 IMPLANT
BENZOIN TINCTURE PRP APPL 2/3 (GAUZE/BANDAGES/DRESSINGS) IMPLANT
BLADE CLIPPER SURG (BLADE) IMPLANT
BUR MATCHSTICK NEURO 3.0 LAGG (BURR) ×2 IMPLANT
BUR PRECISION FLUTE 5.0 (BURR) IMPLANT
CANISTER SUCT 3000ML PPV (MISCELLANEOUS) ×2 IMPLANT
CARTRIDGE OIL MAESTRO DRILL (MISCELLANEOUS) ×1 IMPLANT
DERMABOND ADVANCED (GAUZE/BANDAGES/DRESSINGS) ×1
DERMABOND ADVANCED .7 DNX12 (GAUZE/BANDAGES/DRESSINGS) ×1 IMPLANT
DIFFUSER DRILL AIR PNEUMATIC (MISCELLANEOUS) ×2 IMPLANT
DRAPE LAPAROTOMY 100X72X124 (DRAPES) ×2 IMPLANT
DRAPE MICROSCOPE LEICA (MISCELLANEOUS) ×2 IMPLANT
DRAPE SURG 17X23 STRL (DRAPES) ×2 IMPLANT
DURAPREP 26ML APPLICATOR (WOUND CARE) ×2 IMPLANT
ELECT REM PT RETURN 9FT ADLT (ELECTROSURGICAL) ×2
ELECTRODE REM PT RTRN 9FT ADLT (ELECTROSURGICAL) ×1 IMPLANT
GAUZE 4X4 16PLY ~~LOC~~+RFID DBL (SPONGE) IMPLANT
GAUZE SPONGE 4X4 12PLY STRL (GAUZE/BANDAGES/DRESSINGS) IMPLANT
GLOVE BIO SURGEON STRL SZ8 (GLOVE) ×1 IMPLANT
GLOVE ECLIPSE 6.5 STRL STRAW (GLOVE) ×3 IMPLANT
GLOVE EXAM NITRILE XL STR (GLOVE) IMPLANT
GOWN STRL REUS W/ TWL LRG LVL3 (GOWN DISPOSABLE) ×2 IMPLANT
GOWN STRL REUS W/ TWL XL LVL3 (GOWN DISPOSABLE) IMPLANT
GOWN STRL REUS W/TWL 2XL LVL3 (GOWN DISPOSABLE) IMPLANT
GOWN STRL REUS W/TWL LRG LVL3 (GOWN DISPOSABLE) ×4
GOWN STRL REUS W/TWL XL LVL3 (GOWN DISPOSABLE) ×2
HEMOSTAT POWDER KIT SURGIFOAM (HEMOSTASIS) ×1 IMPLANT
KIT BASIN OR (CUSTOM PROCEDURE TRAY) ×2 IMPLANT
KIT TURNOVER KIT B (KITS) ×2 IMPLANT
NDL HYPO 25X1 1.5 SAFETY (NEEDLE) ×1 IMPLANT
NDL SPNL 18GX3.5 QUINCKE PK (NEEDLE) IMPLANT
NEEDLE HYPO 25X1 1.5 SAFETY (NEEDLE) ×2 IMPLANT
NEEDLE SPNL 18GX3.5 QUINCKE PK (NEEDLE) IMPLANT
NS IRRIG 1000ML POUR BTL (IV SOLUTION) ×2 IMPLANT
OIL CARTRIDGE MAESTRO DRILL (MISCELLANEOUS) ×2
PACK LAMINECTOMY NEURO (CUSTOM PROCEDURE TRAY) ×2 IMPLANT
PAD ARMBOARD 7.5X6 YLW CONV (MISCELLANEOUS) ×6 IMPLANT
SPIKE FLUID TRANSFER (MISCELLANEOUS) ×1 IMPLANT
SPONGE SURGIFOAM ABS GEL SZ50 (HEMOSTASIS) ×2 IMPLANT
SPONGE T-LAP 4X18 ~~LOC~~+RFID (SPONGE) IMPLANT
STRIP CLOSURE SKIN 1/2X4 (GAUZE/BANDAGES/DRESSINGS) IMPLANT
SUT VIC AB 0 CT1 18XCR BRD8 (SUTURE) ×1 IMPLANT
SUT VIC AB 0 CT1 8-18 (SUTURE) ×2
SUT VIC AB 2-0 CT1 18 (SUTURE) ×2 IMPLANT
SUT VIC AB 3-0 SH 8-18 (SUTURE) ×2 IMPLANT
TOWEL GREEN STERILE (TOWEL DISPOSABLE) ×2 IMPLANT
TOWEL GREEN STERILE FF (TOWEL DISPOSABLE) ×2 IMPLANT
WATER STERILE IRR 1000ML POUR (IV SOLUTION) ×2 IMPLANT

## 2022-05-31 NOTE — Transfer of Care (Signed)
Immediate Anesthesia Transfer of Care Note  Patient: Logan Olson  Procedure(s) Performed: MICRODISCECTOMY LUMBAR THREE-FOUR RIGHT LUMBAR FOUR-FIVE (Spine Lumbar)  Patient Location: PACU  Anesthesia Type:General  Level of Consciousness: awake, oriented and drowsy  Airway & Oxygen Therapy: Patient Spontanous Breathing and Patient connected to nasal cannula oxygen  Post-op Assessment: Report given to RN, Post -op Vital signs reviewed and stable and Patient moving all extremities X 4  Post vital signs: Reviewed and stable  Last Vitals:  Vitals Value Taken Time  BP 124/66 05/31/22 1618  Temp    Pulse 56 05/31/22 1620  Resp 12 05/31/22 1620  SpO2 97 % 05/31/22 1620  Vitals shown include unvalidated device data.  Last Pain:  Vitals:   05/31/22 1306  TempSrc:   PainSc: 4       Patients Stated Pain Goal: 2 (94/17/40 8144)  Complications: No notable events documented.

## 2022-05-31 NOTE — Anesthesia Procedure Notes (Signed)
Procedure Name: Intubation Date/Time: 05/31/2022 1:40 PM  Performed by: Gaylene Brooks, CRNAPre-anesthesia Checklist: Patient identified, Emergency Drugs available, Suction available and Patient being monitored Patient Re-evaluated:Patient Re-evaluated prior to induction Oxygen Delivery Method: Circle System Utilized Preoxygenation: Pre-oxygenation with 100% oxygen Induction Type: IV induction Ventilation: Mask ventilation without difficulty and Oral airway inserted - appropriate to patient size Laryngoscope Size: Sabra Heck and 2 Grade View: Grade II Tube type: Oral Tube size: 7.5 mm Number of attempts: 1 Airway Equipment and Method: Stylet and Oral airway Placement Confirmation: ETT inserted through vocal cords under direct vision, positive ETCO2 and breath sounds checked- equal and bilateral Secured at: 22 cm Tube secured with: Tape Dental Injury: Teeth and Oropharynx as per pre-operative assessment

## 2022-05-31 NOTE — Op Note (Signed)
05/31/2022  5:30 PM  PATIENT:  Logan Olson  88 y.o. 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 was taken to the operating room, intubated and placed under a general anesthetic without difficulty. He was positioned prone on a Wilson frame with all pressure points padded. HIs back was prepped and draped in a sterile manner. I opened the skin with a 10 blade and carried the dissection down to the thoracolumbar fascia. I used both sharp dissection and the monopolar cautery to expose the lamina of L3,4, and L5. I confirmed my location with an intraoperative xray.  I used the drill, Kerrison punches, and curettes to perform a semihemilaminectomy of L3, and 4. I ued the punches to remove the ligamentum flavum to expose the thecal sac at both sites. I brought the microscope into the operative field and with Dr.Jones' assistance we started our decompression of the spinal canal, thecal sac and L3,4, and 5 root(s). I cauterized epidural veins overlying the disc space then divided them sharply at L4/5. I opened the disc space with a 15 blade and proceeded with the discectomy. I used pituitary rongeurs, curettes, and other instruments to remove disc material. After the discectomy was completed I inspected the L5 nerve root and felt it was well decompressed. I explored rostrally, laterally, medially, and caudally and was satisfied with the decompression. I did  the same at L3/4 except I did not remove any disc, but the synovial cyst. I used the Kerrison punch and the epstein curettes to remove the cyst wall. It was not adherent to the thecal sac.I irrigated the wound, then closed in layers. I approximated the thoracolumbar fascia, subcutaneous, and subcuticular planes with vicryl sutures. I used dermabond for a sterile dressing.   PLAN OF CARE: Admit for overnight observation  PATIENT DISPOSITION:  PACU - hemodynamically stable.   Delay start of Pharmacological VTE agent (>24hrs) due to surgical blood loss or risk of bleeding:  no

## 2022-05-31 NOTE — H&P (Signed)
Logan Olson is an 88 y.o. male.   Chief Complaint: Right lower extremity pain HPI: with a 3 week history of severe pain in the right lower extremity  Past Medical History:  Diagnosis Date   Arthritis    History of kidney stones    Hypertension    Prostate cancer ([place]) dx'd 07/13/18    Past Surgical History:  Procedure Laterality Date   PROSTATE BIOPSY      Family History  Problem Relation Age of Onset   Breast cancer Sister    Colon cancer Neg Hx    Pancreatic cancer Neg Hx    Prostate cancer Neg Hx    Social History:  reports that he has never smoked. He has never used smokeless tobacco. He reports current alcohol use. He reports that he does not use drugs.  Allergies: No Known Allergies  Medications Prior to Admission  Medication Sig Dispense Refill   amLODipine (NORVASC) 5 MG tablet Take 5 mg by mouth daily.     ibuprofen (ADVIL) 800 MG tablet Take 800 mg by mouth every 8 (eight) hours as needed for moderate pain.     losartan (COZAAR) 100 MG tablet Take 100 mg by mouth daily.     Methyl Salicylate-Lido-Menthol 4-4-5 % PTCH Apply 1 patch topically in the morning and at bedtime. 30 patch 0   oxyCODONE (ROXICODONE) 5 MG immediate release tablet Take 1 tablet (5 mg total) by mouth every 4 (four) hours as needed for severe pain. 5 tablet 0   polyethylene glycol (MIRALAX) 17 g packet Take 17 g by mouth daily. 14 each 0   baclofen (LIORESAL) 10 MG tablet Take 10 mg by mouth 2 (two) times daily.     hydrochlorothiazide (HYDRODIURIL) 25 MG tablet Take 25 mg by mouth daily.     predniSONE (DELTASONE) 20 MG tablet Take 2 tablets (40 mg total) by mouth daily with breakfast. For the next four days (Patient not taking: Reported on 05/24/2022) 8 tablet 0    Results for orders placed or performed during the hospital encounter of 05/31/22 (from the past 48 hour(s))  Surgical pcr screen     Status: Abnormal   Collection Time: 05/31/22  9:12 AM   Specimen: Nasal Mucosa; Nasal Swab   Result Value Ref Range   MRSA, PCR NEGATIVE NEGATIVE   Staphylococcus aureus POSITIVE (A) NEGATIVE    Comment: (NOTE) The Xpert SA Assay (FDA approved for NASAL specimens in patients 11 years of age and older), is one component of a comprehensive surveillance program. It is not intended to diagnose infection nor to guide or monitor treatment. Performed at [facility], [place] [address], [place], [place] [address]    No results found.  Review of Systems  Constitutional:  Positive for activity change.  HENT: Negative.    Eyes: Negative.   Respiratory: Negative.    Cardiovascular: Negative.   Gastrointestinal: Negative.   Endocrine: Negative.   Genitourinary: Negative.   Musculoskeletal:  Positive for back pain.  Neurological:  Positive for weakness.  Hematological: Negative.   Psychiatric/Behavioral: Negative.      Blood pressure 132/66, pulse (!) 53, temperature 98.2 F (36.8 C), temperature source Oral, resp. rate 18, height '6\' 2"'$  (1.88 m), weight 111.1 kg, SpO2 95 %. Physical Exam Constitutional:      General: He is in acute distress.     Appearance: Normal appearance.  HENT:     Head: Normocephalic and atraumatic.     Right Ear: External  ear normal.     Left Ear: External ear normal.     Nose: Nose normal.     Mouth/Throat:     Mouth: Mucous membranes are moist.     Pharynx: Oropharynx is clear.  Eyes:     Extraocular Movements: Extraocular movements intact.     Conjunctiva/sclera: Conjunctivae normal.     Pupils: Pupils are equal, round, and reactive to light.  Cardiovascular:     Rate and Rhythm: Normal rate and regular rhythm.  Pulmonary:     Effort: Pulmonary effort is normal.     Breath sounds: Normal breath sounds.  Abdominal:     General: Abdomen is flat.  Musculoskeletal:        General: Normal range of motion.     Cervical back: Normal range of motion.  Skin:    General: Skin is warm and dry.  Neurological:     Mental Status: He is alert  and oriented to person, place, and time. Mental status is at baseline.     Cranial Nerves: No cranial nerve deficit.     Sensory: Sensation is intact. No sensory deficit.     Motor: Weakness present.     Coordination: Coordination normal.     Gait: Gait abnormal.     Deep Tendon Reflexes: Babinski sign absent on the right side. Babinski sign absent on the left side.     Reflex Scores:      Tricep reflexes are 2+ on the right side and 2+ on the left side.      Bicep reflexes are 2+ on the right side and 2+ on the left side.      Brachioradialis reflexes are 2+ on the right side and 2+ on the left side.      Patellar reflexes are 2+ on the right side and 2+ on the left side.      Achilles reflexes are 2+ on the right side and 2+ on the left side.     Assessment/Plan OR for lumbar laminectomy L3/4,4/5 on the right  for hnp and synovial cyst.   Ashok Pall, MD 05/31/2022, 1:20 PM

## 2022-05-31 NOTE — Anesthesia Preprocedure Evaluation (Signed)
Anesthesia Evaluation  Patient identified by MRN, date of birth, ID band Patient awake    Reviewed: Allergy & Precautions, NPO status , Patient's Chart, lab work & pertinent test results  Airway Mallampati: II  TM Distance: >3 FB Neck ROM: Full    Dental no notable dental hx.    Pulmonary neg pulmonary ROS,    Pulmonary exam normal breath sounds clear to auscultation       Cardiovascular hypertension, Pt. on medications Normal cardiovascular exam Rhythm:Regular Rate:Normal     Neuro/Psych negative neurological ROS  negative psych ROS   GI/Hepatic negative GI ROS, Neg liver ROS,   Endo/Other  negative endocrine ROS  Renal/GU Renal InsufficiencyRenal disease  negative genitourinary   Musculoskeletal negative musculoskeletal ROS (+)   Abdominal   Peds negative pediatric ROS (+)  Hematology negative hematology ROS (+)   Anesthesia Other Findings   Reproductive/Obstetrics negative OB ROS                             Anesthesia Physical Anesthesia Plan  ASA: 2  Anesthesia Plan: General   Post-op Pain Management: Ofirmev IV (intra-op)*   Induction: Intravenous  PONV Risk Score and Plan: 2 and Ondansetron, Dexamethasone and Treatment may vary due to age or medical condition  Airway Management Planned: Oral ETT  Additional Equipment:   Intra-op Plan:   Post-operative Plan: Extubation in OR  Informed Consent: I have reviewed the patients History and Physical, chart, labs and discussed the procedure including the risks, benefits and alternatives for the proposed anesthesia with the patient or authorized representative who has indicated his/her understanding and acceptance.     Dental advisory given  Plan Discussed with: CRNA and Surgeon  Anesthesia Plan Comments:         Anesthesia Quick Evaluation

## 2022-05-31 NOTE — Anesthesia Postprocedure Evaluation (Signed)
Anesthesia Post Note  Patient: Logan Olson  Procedure(s) Performed: MICRODISCECTOMY LUMBAR THREE-FOUR RIGHT LUMBAR FOUR-FIVE (Spine Lumbar)     Patient location during evaluation: PACU Anesthesia Type: General Level of consciousness: awake and alert Pain management: pain level controlled Vital Signs Assessment: post-procedure vital signs reviewed and stable Respiratory status: spontaneous breathing, nonlabored ventilation, respiratory function stable and patient connected to nasal cannula oxygen Cardiovascular status: blood pressure returned to baseline and stable Postop Assessment: no apparent nausea or vomiting Anesthetic complications: no   No notable events documented.  Last Vitals:  Vitals:   05/31/22 1650 05/31/22 1705  BP: 138/66 115/61  Pulse: (!) 51 (!) 53  Resp: 14 13  Temp:    SpO2: 97% 96%    Last Pain:  Vitals:   05/31/22 1705  TempSrc:   PainSc: Asleep                 Rebecka Oelkers S

## 2022-06-01 ENCOUNTER — Encounter (HOSPITAL_COMMUNITY): Payer: Self-pay | Admitting: Neurosurgery

## 2022-06-01 DIAGNOSIS — M5116 Intervertebral disc disorders with radiculopathy, lumbar region: Secondary | ICD-10-CM | POA: Diagnosis not present

## 2022-06-01 DIAGNOSIS — Z79899 Other long term (current) drug therapy: Secondary | ICD-10-CM | POA: Diagnosis not present

## 2022-06-01 DIAGNOSIS — Z8546 Personal history of malignant neoplasm of prostate: Secondary | ICD-10-CM | POA: Diagnosis not present

## 2022-06-01 DIAGNOSIS — I10 Essential (primary) hypertension: Secondary | ICD-10-CM | POA: Diagnosis not present

## 2022-06-01 DIAGNOSIS — M7138 Other bursal cyst, other site: Secondary | ICD-10-CM | POA: Diagnosis not present

## 2022-06-01 LAB — CBC
HCT: 31.3 % — ABNORMAL LOW (ref 39.0–52.0)
Hemoglobin: 10.9 g/dL — ABNORMAL LOW (ref 13.0–17.0)
MCH: 34.9 pg — ABNORMAL HIGH (ref 26.0–34.0)
MCHC: 34.8 g/dL (ref 30.0–36.0)
MCV: 100.3 fL — ABNORMAL HIGH (ref 80.0–100.0)
Platelets: 127 10*3/uL — ABNORMAL LOW (ref 150–400)
RBC: 3.12 MIL/uL — ABNORMAL LOW (ref 4.22–5.81)
RDW: 12.5 % (ref 11.5–15.5)
WBC: 7.4 10*3/uL (ref 4.0–10.5)
nRBC: 0 % (ref 0.0–0.2)

## 2022-06-01 LAB — CREATININE, SERUM
Creatinine, Ser: 1.22 mg/dL (ref 0.61–1.24)
GFR, Estimated: 58 mL/min — ABNORMAL LOW (ref >60)

## 2022-06-01 MED ORDER — OXYCODONE HCL 5 MG PO TABS: 5.0000 mg | ORAL_TABLET | ORAL | 0 refills | Status: AC | PRN

## 2022-06-01 NOTE — Progress Notes (Signed)
Patient alert and oriented, mae's well, voiding adequate amount of urine, swallowing without difficulty, no c/o pain at time of discharge. Patient discharged home with family. Script and discharged instructions given to patient. Patient and family stated understanding of instructions given. Patient has an appointment with Dr. Cabbell   

## 2022-06-01 NOTE — Discharge Summary (Signed)
Physician Discharge Summary  Patient ID: Logan Olson MRN: [id number] DOB/AGE: 86-Apr-1935 75 y.o.  Admit date: 05/31/2022 Discharge date: 06/01/2022  Admission Diagnoses: Lumbar radiculopathy    Discharge Diagnoses: Same   Discharged Condition: good  Hospital Course: The patient was admitted on 05/31/2022 and taken to the operating room where the patient underwent lumbar laminectomy for disc and synovial cyst. The patient tolerated the procedure well and was taken to the recovery room and then to the floor in stable condition. The hospital course was routine. There were no complications. The wound remained clean dry and intact. Pt had appropriate back soreness. No complaints of leg pain or new N/T/W. The patient remained afebrile with stable vital signs, and tolerated a regular diet. The patient continued to increase activities, and pain was well controlled with oral pain medications.   Consults: None  Significant Diagnostic Studies:  Results for orders placed or performed during the hospital encounter of 05/31/22  Surgical pcr screen   Specimen: Nasal Mucosa; Nasal Swab  Result Value Ref Range   MRSA, PCR NEGATIVE NEGATIVE   Staphylococcus aureus POSITIVE (A) NEGATIVE  CBC  Result Value Ref Range   WBC 7.4 4.0 - 10.5 K/uL   RBC 3.12 (L) 4.22 - 5.81 MIL/uL   Hemoglobin 10.9 (L) 13.0 - 17.0 g/dL   HCT 31.3 (L) 39.0 - 52.0 %   MCV 100.3 (H) 80.0 - 100.0 fL   MCH 34.9 (H) 26.0 - 34.0 pg   MCHC 34.8 30.0 - 36.0 g/dL   RDW 12.5 11.5 - 15.5 %   Platelets 127 (L) 150 - 400 K/uL   nRBC 0.0 0.0 - 0.2 %  Creatinine, serum  Result Value Ref Range   Creatinine, Ser 1.22 0.61 - 1.24 mg/dL   GFR, Estimated 58 (L) >60 mL/min    DG Lumbar Spine 2-3 Views  Result Date: 05/31/2022 CLINICAL DATA:  Micro discectomy EXAM: LUMBAR SPINE - 2-3 VIEW COMPARISON:  Lumbar MRI 05/28/2022 FINDINGS: Single lateral intraoperative view of lumbar spine demonstrates chest sharp tip probe posterior to  the L4 vertebral body. IMPRESSION: Intraoperative view as above. Electronically Signed   By: Suzy Bouchard M.D.   On: 05/31/2022 16:20   MR LUMBAR SPINE WO CONTRAST  Result Date: 05/28/2022 CLINICAL DATA:  Low back pain radiating down the right leg for the past 2 weeks. EXAM: MRI LUMBAR SPINE WITHOUT CONTRAST TECHNIQUE: Multiplanar, multisequence MR imaging of the lumbar spine was performed. No intravenous contrast was administered. COMPARISON:  Lumbar spine x-rays dated Apr 02, 2022. FINDINGS: Segmentation:  Standard. Alignment: Unchanged mild dextroscoliosis. Unchanged trace anterolisthesis at L3-L4. Vertebrae: No fracture, evidence of discitis, or bone lesion. Asymmetric right-sided degenerative endplate marrow edema at L4-L5. Conus medullaris and cauda equina: Conus extends to the L1 level. Conus and cauda equina appear normal. Paraspinal and other soft tissues: Negative. Disc levels: T12-L1: Disc degeneration without significant disc bulge or herniation. Mild bilateral facet arthropathy. No stenosis. L1-L2: Minimal disc bulging and mild bilateral facet arthropathy. Mild left lateral recess stenosis. No spinal canal or neuroforaminal stenosis. L2-L3: Moderate disc bulging and mild-to-moderate bilateral facet arthropathy. Mild spinal canal stenosis. Mild-to-moderate left lateral recess stenosis. Mild left neuroforaminal stenosis. No right neuroforaminal stenosis. L3-L4: Disc uncovering and mild-to-moderate disc bulging eccentric to the left. Severe bilateral facet arthropathy. 4 x 6 x 10 mm synovial cyst in the right lateral recess displacing but not clearly compressing the descending right L4 nerve root (series 8, image 25). Moderate to severe spinal canal stenosis.  Mild right and moderate to severe left lateral recess stenosis. Mild left neuroforaminal stenosis. No right neuroforaminal stenosis. L4-L5: Moderate disc bulging with bulky right sided disc osteophyte complex contacting the exiting right L4  nerve root. Moderate right and mild left facet arthropathy. Mild-to-moderate spinal canal stenosis. Severe right and mild left lateral recess stenosis. Moderate right neuroforaminal stenosis. No left neuroforaminal stenosis. L5-S1: Mild disc bulging with right far lateral disc osteophyte complex. Mild bilateral facet arthropathy. No stenosis. IMPRESSION: 1. Multilevel lumbar spondylosis as described above. Severe right lateral recess stenosis at L4-L5 with descending right L5 nerve root impingement. 2. Moderate to severe spinal canal stenosis at L3-L4. 10 mm synovial cyst in the right lateral recess at this level displacing but not clearly compressing the descending right L4 nerve root. Electronically Signed   By: Titus Dubin M.D.   On: 05/28/2022 08:47   DG Chest Port 1 View  Result Date: 05/24/2022 CLINICAL DATA:  Sepsis, hypotension EXAM: PORTABLE CHEST 1 VIEW COMPARISON:  None Available. FINDINGS: The heart size and mediastinal contours are within normal limits. Both lungs are clear. The visualized skeletal structures are unremarkable. IMPRESSION: No active disease. Electronically Signed   By: Randa Ngo M.D.   On: 05/24/2022 18:51   CT Renal Stone Study  Result Date: 05/18/2022 CLINICAL DATA:  RIGHT flank pain, pain extending down RIGHT leg, history prostate cancer, question kidney stone versus sciatica, history hypertension EXAM: CT ABDOMEN AND PELVIS WITHOUT CONTRAST TECHNIQUE: Multidetector CT imaging of the abdomen and pelvis was performed following the standard protocol without IV contrast. RADIATION DOSE REDUCTION: This exam was performed according to the departmental dose-optimization program which includes automated exposure control, adjustment of the mA and/or kV according to patient size and/or use of iterative reconstruction technique. COMPARISON:  10/05/2019 FINDINGS: Lower chest: Bibasilar emphysematous changes with linear scarring Hepatobiliary: Gallbladder and liver unremarkable  Pancreas: Normal appearance Spleen: Normal appearance Adrenals/Urinary Tract: Adrenal glands, kidneys, ureters, and bladder normal appearance. No urinary tract calcification or dilatation. Stomach/Bowel: Normal appendix. Stomach decompressed. Minimal distal colonic diverticulosis without evidence of diverticulitis. Vascular/Lymphatic: Atherosclerotic calcifications aorta coronary arteries. Aorta normal caliber. No adenopathy. Reproductive: Prostatic enlargement.  Seminal vesicles unremarkable. Other: BILATERAL hernias containing fat larger on LEFT. No free air or free fluid. Musculoskeletal: Osseous demineralization with degenerative changes lumbar spine. AP narrowing of spinal canal at L4-L5 due to bulging versus broadly herniated disc. IMPRESSION: No urinary tract calcification or obstruction. Minimal distal colonic diverticulosis. Bulging versus broadly herniated disc at L4-L5 narrowing AP diameter of spinal canal. Aortic Atherosclerosis (ICD10-I70.0). Electronically Signed   By: Lavonia Dana M.D.   On: 05/18/2022 09:59    Antibiotics:  Anti-infectives (From admission, onward)    Start     Dose/Rate Route Frequency Ordered Stop   05/31/22 1330  ceFAZolin (ANCEF) IVPB 2g/100 mL premix        2 g 200 mL/hr over 30 Minutes Intravenous  Once 05/31/22 1326 05/31/22 1400   05/31/22 1316  ceFAZolin (ANCEF) 2-4 GM/100ML-% IVPB       Note to Pharmacy: Wannetta Sender C: cabinet override      05/31/22 1316 05/31/22 1403       Discharge Exam: Blood pressure 108/61, pulse (!) 59, temperature 98.4 F (36.9 C), temperature source Oral, resp. rate 18, height '6\' 2"'$  (1.88 m), weight 111.1 kg, SpO2 95 %. Neurologic: Grossly normal Dressing dry  Discharge Medications:   Allergies as of 06/01/2022   No Known Allergies      Medication List  STOP taking these medications    predniSONE 20 MG tablet Commonly known as: DELTASONE       TAKE these medications    amLODipine 5 MG tablet Commonly known  as: NORVASC Take 5 mg by mouth daily.   baclofen 10 MG tablet Commonly known as: LIORESAL Take 10 mg by mouth 2 (two) times daily.   hydrochlorothiazide 25 MG tablet Commonly known as: HYDRODIURIL Take 25 mg by mouth daily.   ibuprofen 800 MG tablet Commonly known as: ADVIL Take 800 mg by mouth every 8 (eight) hours as needed for moderate pain.   losartan 100 MG tablet Commonly known as: COZAAR Take 100 mg by mouth daily.   Methyl Salicylate-Lido-Menthol 4-4-5 % Ptch Apply 1 patch topically in the morning and at bedtime.   oxyCODONE 5 MG immediate release tablet Commonly known as: Roxicodone Take 1 tablet (5 mg total) by mouth every 4 (four) hours as needed for severe pain.   polyethylene glycol 17 g packet Commonly known as: MiraLax Take 17 g by mouth daily.        Disposition: Home   Final Dx: Lumbar laminectomy  Discharge Instructions      Remove dressing in 72 hours   Complete by: As directed    Call MD for:  difficulty breathing, headache or visual disturbances   Complete by: As directed    Call MD for:  persistant nausea and vomiting   Complete by: As directed    Call MD for:  redness, tenderness, or signs of infection (pain, swelling, redness, odor or green/yellow discharge around incision site)   Complete by: As directed    Call MD for:  severe uncontrolled pain   Complete by: As directed    Call MD for:  temperature >100.4   Complete by: As directed    Diet - low sodium heart healthy   Complete by: As directed    Increase activity slowly   Complete by: As directed           Signed: [name] 06/01/2022, 7:23 AM

## 2022-06-01 NOTE — Progress Notes (Signed)
PT Cancellation Note  Patient Details Name: EDWORD CU MRN: [id number] DOB: 11/07/1934   Cancelled Treatment:    Reason Eval/Treat Not Completed: PT screened, no needs identified, will sign off  Per OT, no skilled PT needs. Will sign off. IF needs change, please re-consult.   Lou Miner, DPT  Acute Rehabilitation Services  Office: [phone]    Rudean Hitt 06/01/2022, 8:47 AM

## 2022-06-01 NOTE — Evaluation (Signed)
Occupational Therapy Evaluation Patient Details Name: Logan [facility] MRN: [id number] DOB: [birth year] Today's Date: 06/01/2022   History of Present Illness 88 yo M s/p lumbar laminectomy for disc and synovial cyst.  PMH includes kidney stones, Hypertension, Prostate cancer.   Clinical Impression   Patient admitted for the diagnosis and procedure above.  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  Presents with expected post op discomfort, but is very close to his baseline.  All precautions reviewed, patient able to walk the halls without an AD, and completed 2 stairs without issue.  Care transfers discussed and all questions answered.  No further needs in the acute setting, recommend f/u as prescribed by MD.       Recommendations for follow up therapy are one component of a multi-disciplinary discharge planning process, led by the attending physician.  Recommendations may be updated based on patient status, additional functional criteria and insurance authorization.   Follow Up Recommendations  No OT follow up    Assistance Recommended at Discharge PRN  Patient can return home with the following      Functional Status Assessment  Patient has not had a recent decline in their functional status  Equipment Recommendations       Recommendations for Other Services       Precautions / Restrictions Precautions Precautions: Back Precaution Booklet Issued: Yes (comment) Precaution Comments: Reviewed and sheet issued Restrictions Weight Bearing Restrictions: No      Mobility Bed Mobility Overal bed mobility: Needs Assistance Bed Mobility: Sidelying to Sit, Sit to Sidelying   Sidelying to sit: Supervision     Sit to sidelying: Supervision General bed mobility comments: vc's for technique    Transfers Overall transfer level: Modified independent                 General transfer comment: occasional reaching for objects in his  environment      Balance Overall balance assessment: Mild deficits observed, not formally tested                                         ADL either performed or assessed with clinical judgement   ADL Overall ADL's : At baseline                                             Vision Patient Visual Report: No change from baseline       Perception Perception Perception: Not tested   Praxis Praxis Praxis: Not tested    Pertinent Vitals/Pain Pain Assessment Pain Assessment: Faces Faces Pain Scale: Hurts little more Pain Location: low back Pain Descriptors / Indicators: Operative site guarding Pain Intervention(s): Monitored during session     Hand Dominance Right   Extremity/Trunk Assessment Upper Extremity Assessment Upper Extremity Assessment: Overall WFL for tasks assessed   Lower Extremity Assessment Lower Extremity Assessment: Overall WFL for tasks assessed   Cervical / Trunk Assessment Cervical / Trunk Assessment: Back Surgery   Communication Communication Communication: No difficulties   Cognition Arousal/Alertness: Awake/alert Behavior During Therapy: WFL for tasks assessed/performed Overall Cognitive Status: Within Functional Limits for tasks assessed  General Comments   VSS on RA    Exercises     Shoulder Instructions      Home Living Family/patient expects to be discharged to:: Private residence Living Arrangements: Spouse/significant other Available Help at Discharge: Family;Available 24 hours/day Type of Home: House Home Access: Stairs to enter [employer] of Steps: 2 Entrance Stairs-Rails: None Home Layout: One level     Bathroom Shower/Tub: Occupational psychologist: Standard Bathroom Accessibility: Yes How Accessible: Accessible via walker Home Equipment: None          Prior Functioning/Environment Prior Level of Function  : Independent/Modified Independent;Driving                        OT Problem List: Pain      OT Treatment/Interventions:      OT Goals(Current goals can be found in the care plan section) Acute Rehab OT Goals Patient Stated Goal: Ready to return home OT Goal Formulation: With patient Time For Goal Achievement: 06/03/22 Potential to Achieve Goals: Good  OT Frequency:      Co-evaluation              AM-PAC OT "6 Clicks" Daily Activity     Outcome Measure Help from another person eating meals?: None Help from another person taking care of personal grooming?: None Help from another person toileting, which includes using toliet, bedpan, or urinal?: None Help from another person bathing (including washing, rinsing, drying)?: None Help from another person to put on and taking off regular upper body clothing?: None Help from another person to put on and taking off regular lower body clothing?: None 6 Click Score: 24   End of Session Nurse Communication: Mobility status  Activity Tolerance: Patient tolerated treatment well Patient left: in bed;with call bell/phone within reach;with family/visitor present  OT Visit Diagnosis: Pain                Time: 7948-0165 OT Time Calculation (min): 22 min Charges:  OT General Charges $OT Visit: 1 Visit OT Evaluation $OT Eval Moderate Complexity: 1 Mod  06/01/2022  RP, OTR/L  Acute Rehabilitation Services  Office:  [phone]   Metta Clines 06/01/2022, 8:49 AM

## 2022-06-02 ENCOUNTER — Other Ambulatory Visit: Payer: Self-pay

## 2022-06-02 ENCOUNTER — Emergency Department (HOSPITAL_COMMUNITY)
Admission: EM | Admit: 2022-06-02 | Discharge: 2022-06-02 | Disposition: A | Discharge status: Home / Self Care | Payer: Medicare Other | Attending: Emergency Medicine | Admitting: Emergency Medicine

## 2022-06-02 ENCOUNTER — Encounter (HOSPITAL_COMMUNITY): Payer: Self-pay

## 2022-06-02 ENCOUNTER — Emergency Department (HOSPITAL_COMMUNITY): Payer: Medicare Other

## 2022-06-02 DIAGNOSIS — Z79899 Other long term (current) drug therapy: Secondary | ICD-10-CM | POA: Diagnosis not present

## 2022-06-02 DIAGNOSIS — M545 Low back pain, unspecified: Secondary | ICD-10-CM | POA: Diagnosis not present

## 2022-06-02 DIAGNOSIS — Z8546 Personal history of malignant neoplasm of prostate: Secondary | ICD-10-CM | POA: Diagnosis not present

## 2022-06-02 DIAGNOSIS — M8588 Other specified disorders of bone density and structure, other site: Secondary | ICD-10-CM | POA: Diagnosis not present

## 2022-06-02 DIAGNOSIS — R001 Bradycardia, unspecified: Secondary | ICD-10-CM | POA: Diagnosis not present

## 2022-06-02 DIAGNOSIS — I10 Essential (primary) hypertension: Secondary | ICD-10-CM | POA: Insufficient documentation

## 2022-06-02 DIAGNOSIS — M4126 Other idiopathic scoliosis, lumbar region: Secondary | ICD-10-CM | POA: Diagnosis not present

## 2022-06-02 DIAGNOSIS — G8918 Other acute postprocedural pain: Secondary | ICD-10-CM | POA: Insufficient documentation

## 2022-06-02 DIAGNOSIS — Z87442 Personal history of urinary calculi: Secondary | ICD-10-CM | POA: Insufficient documentation

## 2022-06-02 MED ORDER — ONDANSETRON 4 MG PO TBDP
4.0000 mg | ORAL_TABLET | Freq: Once | ORAL | Status: AC
  Administered 2022-06-02: 4 mg via ORAL
  Filled 2022-06-02: qty 1

## 2022-06-02 MED ORDER — DEXAMETHASONE SODIUM PHOSPHATE 10 MG/ML IJ SOLN
10.0000 mg | Freq: Once | INTRAMUSCULAR | Status: AC
  Administered 2022-06-02: 10 mg via INTRAMUSCULAR
  Filled 2022-06-02: qty 1

## 2022-06-02 MED ORDER — OXYCODONE-ACETAMINOPHEN 5-325 MG PO TABS
2.0000 | ORAL_TABLET | Freq: Once | ORAL | Status: AC
  Administered 2022-06-02: 2 via ORAL
  Filled 2022-06-02: qty 2

## 2022-06-02 NOTE — ED Triage Notes (Signed)
PT had back surgery on Friday and was discharge yesterday, now having lots of pain and site it hot to touch, no fevers

## 2022-06-02 NOTE — Discharge Instructions (Signed)
It was a pleasure caring for you today in the emergency department. ° °Please return to the emergency department for any worsening or worrisome symptoms. ° ° °

## 2022-06-02 NOTE — ED Provider Triage Note (Signed)
Emergency Medicine Provider Triage Evaluation Note  [facility] , a 88 y.o. male  was evaluated in triage.  Pt complains of low back pain radiating to the RLE. He is 2 days s/p L4/5 microdiscectomy and L 3/4 synovial cyst resection by Dr. Christella Noa. Reports similar pain prior to the surgery, but pain tonight is worse. Has been taking Oxycodone for pain w/o relief; last dose at 2100. Pain aggravated by movement and associated with nausea. No bowel/bladder incontinence, fever, drainage from surgical incision site since discharge.  Review of Systems  Positive: As above Negative: As above  Physical Exam  BP (!) 112/49 (BP Location: Left Arm)   Pulse 64   Temp 97.8 F (36.6 C)   Resp 15   SpO2 96%  Gen:   Awake, mild distress Resp:  Normal effort  MSK:   Moves extremities without difficulty  Other:  DP pulse 2+ bilaterally. Dressing to lumbar midline is C/D/I. Reports decreased sensation in RLE compared to left which patient states is somewhat worse than his presurgical baseline.  Medical Decision Making  Medically screening exam initiated at 5:39 AM.  Appropriate orders placed.  BING DUFFEY was informed that the remainder of the evaluation will be completed by another provider, this initial triage assessment does not replace that evaluation, and the importance of remaining in the ED until their evaluation is complete.  Persistent low back pain s/p microdiscectomy - medications ordered for pain control pending formal evaluation.   Antonietta Breach, PA-C 06/02/22 0543

## 2022-06-02 NOTE — ED Notes (Signed)
Patient verbalizes understanding of discharge instructions. Opportunity for questioning and answers were provided. Armband removed by staff, pt discharged from ED. Pt ambulatory to ED waiting room. 

## 2022-06-02 NOTE — ED Provider Notes (Signed)
[facility] Provider Note   CSN: [id number] Arrival date & time: 06/02/22  0623     History  Chief Complaint  Patient presents with   Post-op Problem    Logan Olson is a 88 y.o. male.  Patient as above with significant medical history as below, including kidney stones, hypertension, recent lumbar microdiscectomy who presents to the ED with complaint of postop pain.  Postop microdiscectomy lumbar 3-4, 4-5 right Dr Christella Noa 7/14.   Patient reports this morning around 2 AM he had severe back pain, pain to his right leg.  Difficulty ambulating but was able to get around the house "gliding on furniture."  No incontinence, no saddle paresthesias, no fevers or chills, no drainage from his surgical wound.  No falls.  No significant treatment with his home analgesic.  Unable to get in touch with neurosurgeon office secondary to after hours.  Came to the ED for evaluation.   Past Medical History:  Diagnosis Date   Arthritis    History of kidney stones    Hypertension    Prostate cancer ([facility]) dx'd 07/13/18    Past Surgical History:  Procedure Laterality Date   LUMBAR LAMINECTOMY/DECOMPRESSION MICRODISCECTOMY N/A 05/31/2022   Procedure: MICRODISCECTOMY LUMBAR THREE-FOUR RIGHT LUMBAR FOUR-FIVE;  Surgeon: Ashok Pall, MD;  Location: [facility];  Service: Neurosurgery;  Laterality: N/A;   PROSTATE BIOPSY       The history is provided by the patient and the spouse. No language interpreter was used.       Home Medications Prior to Admission medications   Medication Sig Start Date End Date Taking? Authorizing Provider  acetaminophen (TYLENOL) 650 MG CR tablet Take 1,300 mg by mouth every 8 (eight) hours as needed for pain.   Yes [provider]  amLODipine (NORVASC) 5 MG tablet Take 5 mg by mouth daily. 11/18/17  Yes [provider]  losartan (COZAAR) 100 MG tablet Take 100 mg by mouth daily. 11/01/19  Yes [provider]   Methyl Salicylate-Lido-Menthol 4-4-5 % PTCH Apply 1 patch topically in the morning and at bedtime. 05/18/22  Yes Carmin Muskrat, MD  oxyCODONE (ROXICODONE) 5 MG immediate release tablet Take 1 tablet (5 mg total) by mouth every 4 (four) hours as needed for severe pain. 06/01/22  Yes [name], MD  polyethylene glycol (MIRALAX) 17 g packet Take 17 g by mouth daily. Patient taking differently: Take 17 g by mouth daily as needed for mild constipation. 05/24/22  Yes [name], Madison, MD      Allergies    Patient has no known allergies.    Review of Systems   Review of Systems  Constitutional:  Negative for chills and fever.  HENT:  Negative for facial swelling and trouble swallowing.   Eyes:  Negative for photophobia and visual disturbance.  Respiratory:  Negative for cough and shortness of breath.   Cardiovascular:  Negative for chest pain and palpitations.  Gastrointestinal:  Negative for abdominal pain, nausea and vomiting.  Endocrine: Negative for polydipsia and polyuria.  Genitourinary:  Negative for difficulty urinating and hematuria.  Musculoskeletal:  Positive for back pain. Negative for gait problem and joint swelling.  Skin:  Negative for pallor and rash.  Neurological:  Negative for syncope and headaches.  Psychiatric/Behavioral:  Negative for agitation and confusion.     Physical Exam Updated Vital Signs BP (!) 126/57   Pulse 72   Temp 97.7 F (36.5 C) (Oral)   Resp 19   Ht  $'6\' 2"'a$  (1.88 m)   Wt 111.1 kg   SpO2 98%   BMI 31.46 kg/m  Physical Exam Vitals and nursing note reviewed.  Constitutional:      General: He is not in acute distress.    Appearance: He is well-developed.  HENT:     Head: Normocephalic and atraumatic.     Right Ear: External ear normal.     Left Ear: External ear normal.     Mouth/Throat:     Mouth: Mucous membranes are moist.  Eyes:     General: No scleral icterus. Cardiovascular:     Rate and Rhythm: Normal rate and regular rhythm.      Pulses: Normal pulses.     Heart sounds: Normal heart sounds.  Pulmonary:     Effort: Pulmonary effort is normal. No respiratory distress.     Breath sounds: Normal breath sounds.  Abdominal:     General: Abdomen is flat.     Palpations: Abdomen is soft.     Tenderness: There is no abdominal tenderness.  Musculoskeletal:        General: Normal range of motion.     Cervical back: Normal range of motion.       Back:     Right lower leg: No edema.     Left lower leg: No edema.  Skin:    General: Skin is warm and dry.     Capillary Refill: Capillary refill takes less than 2 seconds.  Neurological:     Mental Status: He is alert and oriented to person, place, and time.     GCS: GCS eye subscore is 4. GCS verbal subscore is 5. GCS motor subscore is 6.     Sensory: Sensation is intact.     Motor: Motor function is intact.     Gait: Gait is intact. Gait normal.     Comments: Strength equal lower extremities bilateral.  Psychiatric:        Mood and Affect: Mood normal.        Behavior: Behavior normal.      ED Results / Procedures / Treatments   Labs (all labs ordered are listed, but only abnormal results are displayed) Labs Reviewed - No data to display  EKG EKG Interpretation  Date/Time:  Sunday June 02 2022 06:04:02 EDT Ventricular Rate:  57 PR Interval:  224 QRS Duration: 134 QT Interval:  404 QTC Calculation: 393 R Axis:   -57 Text Interpretation: Sinus bradycardia with 1st degree A-V block with Premature atrial complexes Right bundle branch block Left anterior fascicular block Bifascicular block Cannot rule out Anterior infarct , age undetermined Abnormal ECG When compared with ECG of 24-May-2022 18:26, Premature atrial complexes are now present Confirmed by Delora Fuel ([id number]) on 06/02/2022 6:10:00 AM  Radiology DG Lumbar Spine Complete  Result Date: 06/02/2022 CLINICAL DATA:  L4-5 microdiskectomy with L3-4 synovial cyst resection July 14. Back pain is worsening  today. EXAM: LUMBAR SPINE - COMPLETE 4+ VIEW COMPARISON:  Preoperative MRI 05/28/2022. FINDINGS: Osteopenia. There is advanced marginal osteophytosis. Anterior bridging enthesopathy in the lower thoracic spine is noted. There is mild dextrorotary lumbar scoliosis, minimal grade 1 anterolisthesis at L3-4 and otherwise normal alignment. There is preservation of the normal vertebral body heights without evidence of fractures. Multilevel encroachment on the spinal canal space by posterior disc osteophyte complexes is noted, again greatest at L3-4 along with moderate facet hypertrophy from L2-3 down but more advanced at L3-4 than elsewhere. There is multilevel degenerative foraminal stenosis  which was described in detail in the MRI report is not significantly changed. This again most severe on the right at L4-5. Moderate disc space loss again noted L3-4 caudad, normal disc heights above L3. There is aortic atherosclerosis. The SI joints are patent as far seen. IMPRESSION: Osteopenia, scoliosis and advanced degenerative changes. Comparison to prior study reveals no significant interval change. Electronically Signed   By: Telford Nab M.D.   On: 06/02/2022 07:08    Procedures Procedures    Medications Ordered in ED Medications  oxyCODONE-acetaminophen (PERCOCET/ROXICET) 5-325 MG per tablet 2 tablet (2 tablets Oral Given 06/02/22 0554)  dexamethasone (DECADRON) injection 10 mg (10 mg Intramuscular Given 06/02/22 0554)  ondansetron (ZOFRAN-ODT) disintegrating tablet 4 mg (4 mg Oral Given 06/02/22 0554)    ED Course/ Medical Decision Making/ A&P                           Medical Decision Making   CC: Back pain  This patient presents to the Emergency Department for the above complaint. This involves an extensive number of treatment options and is a complaint that carries with it a high risk of complications and morbidity. Vital signs were reviewed. Serious etiologies considered.  DDx includes not limited  to, postop pain, infection, traumatic injury, brain, strain, soft tissue infection, other acute etiologies were considered. Record review:  Previous records obtained and reviewed prior office visits, prior procedures, prior labs and imaging  Additional history obtained from spouse  Medical and surgical history as noted above.   Work up as above, notable for:  Labs & imaging results that were available during my care of the patient were visualized by me and considered in my medical decision making.  Physical exam as above.   Triage ordered imaging studies which included lumbar x-ray. I visualized the imaging, interpreted images, and I agree with radiologist interpretation.  No acute process  Management: Patient given oral analgesic and antiemetic by triage team.  ED Course:     Reassessment:  Pain has resolved.  Gait intact.  He is ambulatory with steady gait.  Feels back to normal.  Neurologically intact.  Admission was considered.     Patient's pain likely secondary to postop pain, discussed adjusting patient's analgesic regimen for home.  Follow-up with neurosurgery tomorrow.  I have exceedingly low suspicion for infectious etiology of patient's discomfort   The patient improved significantly and was discharged in stable condition. Detailed discussions were had with the patient regarding current findings, and need for close f/u with PCP or on call doctor. The patient has been instructed to return immediately if the symptoms worsen in any way for re-evaluation. Patient verbalized understanding and is in agreement with current care plan. All questions answered prior to discharge.          Social determinants of health include -  Social History   Socioeconomic History   Marital status: Married    Spouse name: Not on file   Number of children: Not on file   Years of education: Not on file   Highest education level: Not on file  Occupational History   Not on file   Tobacco Use   Smoking status: Never   Smokeless tobacco: Never  Vaping Use   Vaping Use: Never used  Substance and Sexual Activity   Alcohol use: Yes    Comment: social   Drug use: Never   Sexual activity: Not Currently  Other Topics Concern  Not on file  Social History Narrative   Not on file   Social Determinants of Health   Financial Resource Strain: Not on file  Food Insecurity: Not on file  Transportation Needs: Not on file  Physical Activity: Not on file  Stress: Not on file  Social Connections: Not on file  Intimate Partner Violence: Not on file      This chart was dictated using voice recognition software.  Despite best efforts to proofread,  errors can occur which can change the documentation meaning.         Final Clinical Impression(s) / ED Diagnoses Final diagnoses:  None    Rx / DC Orders ED Discharge Orders     None         Jeanell Sparrow, DO 06/02/22 1605

## 2022-06-03 ENCOUNTER — Other Ambulatory Visit: Payer: Self-pay

## 2022-06-03 MED FILL — Thrombin For Soln 5000 Unit: CUTANEOUS | Qty: 2 | Status: AC

## 2022-06-12 ENCOUNTER — Other Ambulatory Visit (HOSPITAL_COMMUNITY): Payer: Self-pay | Admitting: Neurosurgery

## 2022-06-12 ENCOUNTER — Other Ambulatory Visit: Payer: Self-pay | Admitting: Neurosurgery

## 2022-06-12 DIAGNOSIS — M7130 Other bursal cyst, unspecified site: Secondary | ICD-10-CM

## 2022-06-13 DIAGNOSIS — R200 Anesthesia of skin: Secondary | ICD-10-CM | POA: Diagnosis not present

## 2022-06-13 DIAGNOSIS — M7130 Other bursal cyst, unspecified site: Secondary | ICD-10-CM | POA: Diagnosis not present

## 2022-06-15 ENCOUNTER — Other Ambulatory Visit: Payer: Self-pay

## 2022-06-15 ENCOUNTER — Encounter (HOSPITAL_COMMUNITY): Payer: Self-pay

## 2022-06-15 ENCOUNTER — Inpatient Hospital Stay (HOSPITAL_COMMUNITY)
Admission: EM | Admit: 2022-06-15 | Discharge: 2022-06-17 | DRG: 556 | Disposition: A | Discharge status: Home / Self Care | Payer: Medicare Other | Attending: Neurosurgery | Admitting: Neurosurgery

## 2022-06-15 DIAGNOSIS — Z79899 Other long term (current) drug therapy: Secondary | ICD-10-CM | POA: Diagnosis not present

## 2022-06-15 DIAGNOSIS — M6281 Muscle weakness (generalized): Secondary | ICD-10-CM | POA: Diagnosis not present

## 2022-06-15 DIAGNOSIS — Z87442 Personal history of urinary calculi: Secondary | ICD-10-CM | POA: Diagnosis not present

## 2022-06-15 DIAGNOSIS — R200 Anesthesia of skin: Secondary | ICD-10-CM | POA: Diagnosis present

## 2022-06-15 DIAGNOSIS — R29898 Other symptoms and signs involving the musculoskeletal system: Principal | ICD-10-CM | POA: Diagnosis present

## 2022-06-15 DIAGNOSIS — Z9889 Other specified postprocedural states: Secondary | ICD-10-CM | POA: Diagnosis not present

## 2022-06-15 DIAGNOSIS — I10 Essential (primary) hypertension: Secondary | ICD-10-CM | POA: Diagnosis present

## 2022-06-15 DIAGNOSIS — M1990 Unspecified osteoarthritis, unspecified site: Secondary | ICD-10-CM | POA: Diagnosis present

## 2022-06-15 DIAGNOSIS — Z8546 Personal history of malignant neoplasm of prostate: Secondary | ICD-10-CM | POA: Diagnosis not present

## 2022-06-15 DIAGNOSIS — R531 Weakness: Principal | ICD-10-CM

## 2022-06-15 DIAGNOSIS — M549 Dorsalgia, unspecified: Secondary | ICD-10-CM | POA: Diagnosis present

## 2022-06-15 DIAGNOSIS — Z803 Family history of malignant neoplasm of breast: Secondary | ICD-10-CM | POA: Diagnosis not present

## 2022-06-15 DIAGNOSIS — M79604 Pain in right leg: Secondary | ICD-10-CM | POA: Diagnosis present

## 2022-06-15 LAB — COMPREHENSIVE METABOLIC PANEL
ALT: 20 U/L (ref 0–44)
AST: 20 U/L (ref 15–41)
Albumin: 3.5 g/dL (ref 3.5–5.0)
Alkaline Phosphatase: 39 U/L (ref 38–126)
Anion gap: 5 (ref 5–15)
BUN: 15 mg/dL (ref 8–23)
CO2: 26 mmol/L (ref 22–32)
Calcium: 8.9 mg/dL (ref 8.9–10.3)
Chloride: 106 mmol/L (ref 98–111)
Creatinine, Ser: 1.25 mg/dL — ABNORMAL HIGH (ref 0.61–1.24)
GFR, Estimated: 56 mL/min — ABNORMAL LOW (ref >60)
Glucose, Bld: 113 mg/dL — ABNORMAL HIGH (ref 70–99)
Potassium: 3.6 mmol/L (ref 3.5–5.1)
Sodium: 137 mmol/L (ref 135–145)
Total Bilirubin: 2.2 mg/dL — ABNORMAL HIGH (ref 0.3–1.2)
Total Protein: 6.2 g/dL — ABNORMAL LOW (ref 6.5–8.1)

## 2022-06-15 LAB — CBC WITH DIFFERENTIAL/PLATELET
Abs Immature Granulocytes: 0.06 10*3/uL (ref 0.00–0.07)
Basophils Absolute: 0 10*3/uL (ref 0.0–0.1)
Basophils Relative: 0 %
Eosinophils Absolute: 0.1 10*3/uL (ref 0.0–0.5)
Eosinophils Relative: 3 %
HCT: 32.3 % — ABNORMAL LOW (ref 39.0–52.0)
Hemoglobin: 10.6 g/dL — ABNORMAL LOW (ref 13.0–17.0)
Immature Granulocytes: 2 %
Lymphocytes Relative: 30 %
Lymphs Abs: 1.2 10*3/uL (ref 0.7–4.0)
MCH: 33.7 pg (ref 26.0–34.0)
MCHC: 32.8 g/dL (ref 30.0–36.0)
MCV: 102.5 fL — ABNORMAL HIGH (ref 80.0–100.0)
Monocytes Absolute: 0.4 10*3/uL (ref 0.1–1.0)
Monocytes Relative: 10 %
Neutro Abs: 2.3 10*3/uL (ref 1.7–7.7)
Neutrophils Relative %: 55 %
Platelets: 165 10*3/uL (ref 150–400)
RBC: 3.15 MIL/uL — ABNORMAL LOW (ref 4.22–5.81)
RDW: 13 % (ref 11.5–15.5)
WBC: 4.1 10*3/uL (ref 4.0–10.5)
nRBC: 0 % (ref 0.0–0.2)

## 2022-06-15 LAB — CBG MONITORING, ED: Glucose-Capillary: 98 mg/dL (ref 70–99)

## 2022-06-15 MED ORDER — SODIUM CHLORIDE 0.9 % IV BOLUS
500.0000 mL | Freq: Once | INTRAVENOUS | Status: AC
  Administered 2022-06-15: 500 mL via INTRAVENOUS

## 2022-06-15 MED ORDER — ACETAMINOPHEN 650 MG RE SUPP: 650.0000 mg | Freq: Four times a day (QID) | RECTAL | Status: DC | PRN

## 2022-06-15 MED ORDER — POTASSIUM CHLORIDE IN NACL 20-0.9 MEQ/L-% IV SOLN
INTRAVENOUS | Status: DC
  Filled 2022-06-15 (×3): qty 1000

## 2022-06-15 MED ORDER — LOSARTAN POTASSIUM 50 MG PO TABS
100.0000 mg | ORAL_TABLET | Freq: Every day | ORAL | Status: DC
  Administered 2022-06-16 – 2022-06-17 (×2): 100 mg via ORAL
  Filled 2022-06-15 (×2): qty 2

## 2022-06-15 MED ORDER — OXYCODONE HCL 5 MG PO TABS
5.0000 mg | ORAL_TABLET | ORAL | Status: DC | PRN
  Filled 2022-06-15 (×2): qty 1

## 2022-06-15 MED ORDER — SENNA 8.6 MG PO TABS
1.0000 | ORAL_TABLET | Freq: Two times a day (BID) | ORAL | Status: DC
  Administered 2022-06-16 – 2022-06-17 (×2): 8.6 mg via ORAL
  Filled 2022-06-15 (×3): qty 1

## 2022-06-15 MED ORDER — HEPARIN SODIUM (PORCINE) 5000 UNIT/ML IJ SOLN
5000.0000 [IU] | Freq: Three times a day (TID) | INTRAMUSCULAR | Status: DC
  Administered 2022-06-15 – 2022-06-17 (×5): 5000 [IU] via SUBCUTANEOUS
  Filled 2022-06-15 (×5): qty 1

## 2022-06-15 MED ORDER — BISACODYL 5 MG PO TBEC: 5.0000 mg | DELAYED_RELEASE_TABLET | Freq: Every day | ORAL | Status: DC | PRN

## 2022-06-15 MED ORDER — AMLODIPINE BESYLATE 5 MG PO TABS
5.0000 mg | ORAL_TABLET | Freq: Every day | ORAL | Status: DC
  Administered 2022-06-16 – 2022-06-17 (×2): 5 mg via ORAL
  Filled 2022-06-15 (×2): qty 1

## 2022-06-15 MED ORDER — HYDROMORPHONE HCL 1 MG/ML IJ SOLN
0.5000 mg | Freq: Once | INTRAMUSCULAR | Status: AC
  Administered 2022-06-15: 0.5 mg via INTRAVENOUS
  Filled 2022-06-15: qty 0.5

## 2022-06-15 MED ORDER — HYDROMORPHONE HCL 1 MG/ML IJ SOLN
0.5000 mg | INTRAMUSCULAR | Status: DC | PRN
  Administered 2022-06-15 – 2022-06-16 (×5): 1 mg via INTRAVENOUS
  Filled 2022-06-15 (×5): qty 1

## 2022-06-15 MED ORDER — ZOLPIDEM TARTRATE 5 MG PO TABS: 5.0000 mg | ORAL_TABLET | Freq: Every evening | ORAL | Status: DC | PRN

## 2022-06-15 MED ORDER — ONDANSETRON HCL 4 MG/2ML IJ SOLN: 4.0000 mg | Freq: Four times a day (QID) | INTRAMUSCULAR | Status: DC | PRN

## 2022-06-15 MED ORDER — SENNOSIDES-DOCUSATE SODIUM 8.6-50 MG PO TABS: 1.0000 | ORAL_TABLET | Freq: Every evening | ORAL | Status: DC | PRN

## 2022-06-15 MED ORDER — POLYETHYLENE GLYCOL 3350 17 G PO PACK
17.0000 g | PACK | Freq: Every day | ORAL | Status: DC | PRN
Start: 2022-06-15 — End: 2022-06-18

## 2022-06-15 MED ORDER — DIAZEPAM 5 MG PO TABS: 5.0000 mg | ORAL_TABLET | Freq: Four times a day (QID) | ORAL | Status: DC | PRN

## 2022-06-15 MED ORDER — ONDANSETRON HCL 4 MG PO TABS: 4.0000 mg | ORAL_TABLET | Freq: Four times a day (QID) | ORAL | Status: DC | PRN

## 2022-06-15 MED ORDER — ACETAMINOPHEN 325 MG PO TABS
650.0000 mg | ORAL_TABLET | Freq: Four times a day (QID) | ORAL | Status: DC | PRN
  Administered 2022-06-16: 650 mg via ORAL
  Filled 2022-06-15: qty 2

## 2022-06-15 NOTE — ED Provider Notes (Signed)
[facility] EMERGENCY DEPARTMENT Provider Note   CSN: [id number] Arrival date & time: 06/15/22  1522     History {Add pertinent medical, surgical, social history, OB history to HPI:1} Chief Complaint  Patient presents with   Numbness    [facility] is a 88 y.o. male.  Patient had lumbar laminectomy done on 715 by Dr. Christella Noa.  He also had an MRI on Thursday of his lumbar spine.  Patient complains of progressive weakness and numbness in his lower extremities with pain in his back.  He is too weak to ambulate   Extremity Weakness       Home Medications Prior to Admission medications   Medication Sig Start Date End Date Taking? Authorizing Provider  acetaminophen (TYLENOL) 650 MG CR tablet Take 1,300 mg by mouth every 8 (eight) hours as needed for pain.    [provider]  amLODipine (NORVASC) 5 MG tablet Take 5 mg by mouth daily. 11/18/17   [provider]  losartan (COZAAR) 100 MG tablet Take 100 mg by mouth daily. 11/01/19   [provider]  Methyl Salicylate-Lido-Menthol 4-4-5 % PTCH Apply 1 patch topically in the morning and at bedtime. 05/18/22   Carmin Muskrat, MD  oxyCODONE (ROXICODONE) 5 MG immediate release tablet Take 1 tablet (5 mg total) by mouth every 4 (four) hours as needed for severe pain. 06/01/22   [name], MD  polyethylene glycol (MIRALAX) 17 g packet Take 17 g by mouth daily. Patient taking differently: Take 17 g by mouth daily as needed for mild constipation. 05/24/22   [facility], [name], MD      Allergies    Patient has no known allergies.    Review of Systems   Review of Systems  Musculoskeletal:  Positive for extremity weakness.    Physical Exam Updated Vital Signs BP (!) 148/70   Pulse 64   Resp 18   Ht '6\' 2"'$  (1.88 m)   Wt 111.1 kg   SpO2 100%   BMI 31.46 kg/m  Physical Exam  ED Results / Procedures / Treatments   Labs (all labs ordered are listed, but only abnormal results are displayed) Labs  Reviewed  CBC WITH DIFFERENTIAL/PLATELET - Abnormal; Notable for the following components:      Result Value   RBC 3.15 (*)    Hemoglobin 10.6 (*)    HCT 32.3 (*)    MCV 102.5 (*)    All other components within normal limits  COMPREHENSIVE METABOLIC PANEL - Abnormal; Notable for the following components:   Glucose, Bld 113 (*)    Creatinine, Ser 1.25 (*)    Total Protein 6.2 (*)    Total Bilirubin 2.2 (*)    GFR, Estimated 56 (*)    All other components within normal limits  CBG MONITORING, ED    EKG None  Radiology No results found.  Procedures Procedures  {Document cardiac monitor, telemetry assessment procedure when appropriate:1}  Medications Ordered in ED Medications  sodium chloride 0.9 % bolus 500 mL (500 mLs Intravenous New Bag/Given 06/15/22 1602)    ED Course/ Medical Decision Making/ A&P Patient with progressive weakness in his lower extremities.  I spoke with Dr. Christella Noa who is his neurosurgeon and he recommended admitting the patient over to [facility].  Dr. Christella Noa will be the admitting physician                         Medical Decision Making Amount and/or Complexity  of Data Reviewed Labs: ordered. ECG/medicine tests: ordered.  Risk Decision regarding hospitalization.   Patient with bilateral lower extremity weakness status post recent laminectomy.  MRI of the lumbar spine unremarkable 2 days ago  {Document critical care time when appropriate:1} {Document review of labs and clinical decision tools ie heart score, Chads2Vasc2 etc:1}  {Document your independent review of radiology images, and any outside records:1} {Document your discussion with family members, caretakers, and with consultants:1} {Document social determinants of health affecting pt's care:1} {Document your decision making why or why not admission, treatments were needed:1} Final Clinical Impression(s) / ED Diagnoses Final diagnoses:  None    Rx / DC Orders ED Discharge Orders      None

## 2022-06-15 NOTE — Progress Notes (Signed)
Patient ID: [facility], male   DOB: Oct 12, 1934, 88 y.o.   MRN: [id number] Lumbar spine MRI w/wo contrast reviewed. No findings consistent with losing the ability to walk or for bilateral lower extremity numbness.

## 2022-06-15 NOTE — ED Notes (Signed)
[facility] arrived  

## 2022-06-15 NOTE — ED Triage Notes (Signed)
Patient coming in after having recent back surgery.  States that both legs went numb.  Also states that he felt faint when walking into the ED and states he leaned over on the desk and doesn't remember anything else.

## 2022-06-15 NOTE — ED Triage Notes (Signed)
Patient states that he was able to walk after surgery, but has had pain.  States that he lost the legs have gotten weak and hard for him to walk.  Started approximately an hour ago.

## 2022-06-16 MED ORDER — GABAPENTIN 300 MG PO CAPS
300.0000 mg | ORAL_CAPSULE | Freq: Two times a day (BID) | ORAL | Status: DC
  Administered 2022-06-16 – 2022-06-17 (×4): 300 mg via ORAL
  Filled 2022-06-16 (×4): qty 1

## 2022-06-16 MED ORDER — PHENOL 1.4 % MT LIQD
1.0000 | OROMUCOSAL | Status: DC | PRN
Start: 2022-06-16 — End: 2022-06-18
  Administered 2022-06-16: 1 via OROMUCOSAL
  Filled 2022-06-16: qty 177

## 2022-06-16 MED ORDER — DEXAMETHASONE SODIUM PHOSPHATE 10 MG/ML IJ SOLN
10.0000 mg | Freq: Two times a day (BID) | INTRAMUSCULAR | Status: DC
Start: 2022-06-16 — End: 2022-06-16

## 2022-06-16 MED ORDER — DEXAMETHASONE SODIUM PHOSPHATE 10 MG/ML IJ SOLN
6.0000 mg | Freq: Two times a day (BID) | INTRAMUSCULAR | Status: AC
  Administered 2022-06-16 – 2022-06-17 (×3): 6 mg via INTRAVENOUS
  Filled 2022-06-16 (×3): qty 1

## 2022-06-16 NOTE — H&P (Signed)
Logan Olson is an 88 y.o. male.   Chief Complaint: right leg pain, bilateral leg weakness HPI: whom underwent a laminectomy at L3/4, and L4/5 on the right side on 7/14 Post op he has had significant pain in his right lower extremity. He went to the ED two days post op for pain. MRI done last last wee  Past Medical History:  Diagnosis Date   Arthritis    History of kidney stones    Hypertension    Prostate cancer ([place]) dx'd 07/13/18    Past Surgical History:  Procedure Laterality Date   LUMBAR LAMINECTOMY/DECOMPRESSION MICRODISCECTOMY N/A 05/31/2022   Procedure: MICRODISCECTOMY LUMBAR THREE-FOUR RIGHT LUMBAR FOUR-FIVE;  Surgeon: Ashok Pall, MD;  Location: [facility];  Service: Neurosurgery;  Laterality: N/A;   PROSTATE BIOPSY      Family History  Problem Relation Age of Onset   Breast cancer Sister    Colon cancer Neg Hx    Pancreatic cancer Neg Hx    Prostate cancer Neg Hx    Social History:  reports that he has never smoked. He has never used smokeless tobacco. He reports current alcohol use. He reports that he does not use drugs.  Allergies: No Known Allergies  Medications Prior to Admission  Medication Sig Dispense Refill   acetaminophen (TYLENOL) 650 MG CR tablet Take 650 mg by mouth every 8 (eight) hours as needed for pain.     amLODipine (NORVASC) 5 MG tablet Take 5 mg by mouth daily.     losartan (COZAAR) 100 MG tablet Take 100 mg by mouth daily.     oxyCODONE (ROXICODONE) 5 MG immediate release tablet Take 1 tablet (5 mg total) by mouth every 4 (four) hours as needed for severe pain. 25 tablet 0   polyethylene glycol (MIRALAX) 17 g packet Take 17 g by mouth daily. (Patient taking differently: Take 17 g by mouth daily as needed for mild constipation.) 14 each 0   Methyl Salicylate-Lido-Menthol 4-4-5 % PTCH Apply 1 patch topically in the morning and at bedtime. 30 patch 0    Results for orders placed or performed during the hospital encounter of 06/15/22 (from the past  48 hour(s))  CBG monitoring, ED     Status: None   Collection Time: 06/15/22  3:33 PM  Result Value Ref Range   Glucose-Capillary 98 70 - 99 mg/dL    Comment: Glucose reference range applies only to samples taken after fasting for at least 8 hours.  CBC with Differential     Status: Abnormal   Collection Time: 06/15/22  3:40 PM  Result Value Ref Range   WBC 4.1 4.0 - 10.5 K/uL   RBC 3.15 (L) 4.22 - 5.81 MIL/uL   Hemoglobin 10.6 (L) 13.0 - 17.0 g/dL   HCT 32.3 (L) 39.0 - 52.0 %   MCV 102.5 (H) 80.0 - 100.0 fL   MCH 33.7 26.0 - 34.0 pg   MCHC 32.8 30.0 - 36.0 g/dL   RDW 13.0 11.5 - 15.5 %   Platelets 165 150 - 400 K/uL   nRBC 0.0 0.0 - 0.2 %   Neutrophils Relative % 55 %   Neutro Abs 2.3 1.7 - 7.7 K/uL   Lymphocytes Relative 30 %   Lymphs Abs 1.2 0.7 - 4.0 K/uL   Monocytes Relative 10 %   Monocytes Absolute 0.4 0.1 - 1.0 K/uL   Eosinophils Relative 3 %   Eosinophils Absolute 0.1 0.0 - 0.5 K/uL   Basophils Relative 0 %  Basophils Absolute 0.0 0.0 - 0.1 K/uL   Immature Granulocytes 2 %   Abs Immature Granulocytes 0.06 0.00 - 0.07 K/uL    Comment: Performed at [facility], [address] [place], [place] [address]  Comprehensive metabolic panel     Status: Abnormal   Collection Time: 06/15/22  3:40 PM  Result Value Ref Range   Sodium 137 135 - 145 mmol/L   Potassium 3.6 3.5 - 5.1 mmol/L   Chloride 106 98 - 111 mmol/L   CO2 26 22 - 32 mmol/L   Glucose, Bld 113 (H) 70 - 99 mg/dL    Comment: Glucose reference range applies only to samples taken after fasting for at least 8 hours.   BUN 15 8 - 23 mg/dL   Creatinine, Ser 1.25 (H) 0.61 - 1.24 mg/dL   Calcium 8.9 8.9 - 10.3 mg/dL   Total Protein 6.2 (L) 6.5 - 8.1 g/dL   Albumin 3.5 3.5 - 5.0 g/dL   AST 20 15 - 41 U/L   ALT 20 0 - 44 U/L   Alkaline Phosphatase 39 38 - 126 U/L   Total Bilirubin 2.2 (H) 0.3 - 1.2 mg/dL   GFR, Estimated 56 (L) >60 mL/min    Comment: (NOTE) Calculated using the CKD-EPI Creatinine Equation  (2021)    Anion gap 5 5 - 15    Comment: Performed at [facility], [address] [place],  [address]   No results found.  Review of Systems  Constitutional: Negative.   HENT: Negative.    Eyes: Negative.   Respiratory: Negative.    Cardiovascular:  Positive for leg swelling.  Gastrointestinal: Negative.   Endocrine: Negative.   Genitourinary: Negative.   Musculoskeletal:  Positive for back pain.  Skin: Negative.   Allergic/Immunologic: Negative.   Neurological:  Positive for weakness.  Hematological: Negative.   Psychiatric/Behavioral: Negative.      Blood pressure (!) 156/69, pulse (!) 59, temperature 97.6 F (36.4 C), temperature source Oral, resp. rate 14, height '6\' 2"'$  (1.88 m), weight 115.8 kg, SpO2 94 %. Physical Exam Constitutional:      General: He is not in acute distress.    Appearance: Normal appearance. He is not ill-appearing or toxic-appearing.  HENT:     Head: Normocephalic and atraumatic.     Right Ear: External ear normal.     Left Ear: External ear normal.     Nose: Nose normal.     Mouth/Throat:     Mouth: Mucous membranes are moist.     Pharynx: Oropharynx is clear.  Eyes:     Extraocular Movements: Extraocular movements intact.     Conjunctiva/sclera: Conjunctivae normal.     Pupils: Pupils are equal, round, and reactive to light.  Cardiovascular:     Rate and Rhythm: Normal rate and regular rhythm.  Pulmonary:     Effort: Pulmonary effort is normal.  Abdominal:     General: Abdomen is flat.     Palpations: Abdomen is soft.  Musculoskeletal:        General: Normal range of motion.     Cervical back: Normal range of motion.  Skin:    General: Skin is warm and dry.  Neurological:     General: No focal deficit present.     Mental Status: He is alert and oriented to person, place, and time. Mental status is at baseline.     Sensory: No sensory deficit.     Motor: Weakness present.     Coordination: Coordination  normal.  Psychiatric:         Mood and Affect: Mood normal.        Behavior: Behavior normal.        Thought Content: Thought content normal.        Judgment: Judgment normal.      Assessment/Plan Logan Olson is a 88 y.o. male With recent lumbar surgery. He is now comfortable with pain medications. Weakness in lower extremities was due to pain. Will work with PT tomorrow. Given steroids today.   Ashok Pall, MD 06/16/2022, 10:25 AM

## 2022-06-17 MED ORDER — GABAPENTIN 300 MG PO CAPS: 300.0000 mg | ORAL_CAPSULE | Freq: Three times a day (TID) | ORAL | 0 refills | Status: AC

## 2022-06-17 MED ORDER — DIAZEPAM 5 MG PO TABS: 5.0000 mg | ORAL_TABLET | Freq: Four times a day (QID) | ORAL | 0 refills | Status: AC | PRN

## 2022-06-17 NOTE — Evaluation (Signed)
Physical Therapy Evaluation Patient Details Name: Logan Olson MRN: [id number] DOB: 1934/02/05 Today's Date: 06/17/2022  History of Present Illness  Pt is 88 yo male admitted 06/15/22 with R leg pain and bil leg weakness.  Neurosurgery consulted, MRI stable, steriods given.  He underwent L3/4 and L4/5 on right side on 7/14 and with increased pain since sx (reports multiple visits to ED).  Hx of arthritis, HTN, prostate CA  Clinical Impression  Pt admitted with above diagnosis. Pt had increased back and leg pain with weakness at home since sx.  Pt has been started on steriods and addition of gabapentin and he reports his pain is much improved.  Reports no pain during therapy.  Pt has support at home, cane, and recalled back precautions.  He was able to ambulate 200' and performed 3 steps with supervision.  Demonstrating slightly decreased stride length and speed.  Will benefit from acute PT while admitted to advance and continue to educate on back precautions with mobility. Pt currently with functional limitations due to the deficits listed below (see PT Problem List). Pt will benefit from skilled PT to increase their independence and safety with mobility to allow discharge to the venue listed below.          Recommendations for follow up therapy are one component of a multi-disciplinary discharge planning process, led by the attending physician.  Recommendations may be updated based on patient status, additional functional criteria and insurance authorization.  Follow Up Recommendations No PT follow up      Assistance Recommended at Discharge PRN  Patient can return home with the following  Assistance with cooking/housework    Equipment Recommendations None recommended by PT  Recommendations for Other Services       Functional Status Assessment Patient has had a recent decline in their functional status and demonstrates the ability to make significant improvements in function in a  reasonable and predictable amount of time.     Precautions / Restrictions Precautions Precautions: Back Precaution Booklet Issued: Yes (comment) Precaution Comments: reviewed back precautions      Mobility  Bed Mobility Overal bed mobility: Needs Assistance Bed Mobility: Sidelying to Sit, Sit to Sidelying   Sidelying to sit: Supervision     Sit to sidelying: Supervision      Transfers Overall transfer level: Needs assistance Equipment used: None Transfers: Sit to/from Stand Sit to Stand: Supervision                Ambulation/Gait Ambulation/Gait assistance: Supervision Gait Distance (Feet): 200 Feet Assistive device: None Gait Pattern/deviations: Decreased stride length       General Gait Details: Slight decrease in speed and stride length  Stairs    Stairs: Up/down 3 steps with min guard and 1 rail with step to pattern        Wheelchair Mobility    Modified Rankin (Stroke Patients Only)       Balance Overall balance assessment: Needs assistance Sitting-balance support: No upper extremity supported Sitting balance-Leahy Scale: Good     Standing balance support: No upper extremity supported Standing balance-Leahy Scale: Good                               Pertinent Vitals/Pain Pain Assessment Pain Assessment: No/denies pain    Home Living Family/patient expects to be discharged to:: Private residence Living Arrangements: Spouse/significant other Available Help at Discharge: Family;Available 24 hours/day Type of Home: House Home Access:  Stairs to enter Entrance Stairs-Rails: None Entrance Stairs-Number of Steps: 2   Home Layout: One level Home Equipment: Cane - single point      Prior Function Prior Level of Function : Independent/Modified Independent;Driving                     Hand Dominance        Extremity/Trunk Assessment   Upper Extremity Assessment Upper Extremity Assessment: Overall WFL for tasks  assessed    Lower Extremity Assessment Lower Extremity Assessment: Overall WFL for tasks assessed (ROM WFL; MMT at least 3/5 not further tested due to recent back sx; sensation intact; reports edema much improved only slight edema ankles)    Cervical / Trunk Assessment Cervical / Trunk Assessment: Back Surgery  Communication   Communication: No difficulties  Cognition Arousal/Alertness: Awake/alert Behavior During Therapy: WFL for tasks assessed/performed Overall Cognitive Status: Within Functional Limits for tasks assessed                                 General Comments: REcalled and followed all back precautions        General Comments General comments (skin integrity, edema, etc.): Pt recalling back precautions.  PT reviewed situations to be cautious with back precautions including but not limited to turning to flush toilet, grabbing seat belt, back support and not extending quickly when leaning head back to take meds    Exercises     Assessment/Plan    PT Assessment Patient needs continued PT services  PT Problem List Decreased mobility;Decreased strength;Decreased knowledge of precautions;Decreased activity tolerance;Decreased balance;Pain       PT Treatment Interventions DME instruction;Therapeutic activities;Modalities;Gait training;Therapeutic exercise;Patient/family education;Stair training;Balance training;Functional mobility training    PT Goals (Current goals can be found in the Care Plan section)  Acute Rehab PT Goals Patient Stated Goal: return home; continue to have good pain management PT Goal Formulation: With patient/family Time For Goal Achievement: 07/01/22 Potential to Achieve Goals: Good    Frequency Min 3X/week     Co-evaluation               AM-PAC PT "6 Clicks" Mobility  Outcome Measure Help needed turning from your back to your side while in a flat bed without using bedrails?: A Little Help needed moving from lying on  your back to sitting on the side of a flat bed without using bedrails?: A Little Help needed moving to and from a bed to a chair (including a wheelchair)?: A Little Help needed standing up from a chair using your arms (e.g., wheelchair or bedside chair)?: A Little Help needed to walk in hospital room?: A Little Help needed climbing 3-5 steps with a railing? : A Little 6 Click Score: 18    End of Session   Activity Tolerance: Patient tolerated treatment well Patient left: in bed;with call bell/phone within reach Nurse Communication: Mobility status PT Visit Diagnosis: Other abnormalities of gait and mobility (R26.89)    Time: 4709-6283 PT Time Calculation (min) (ACUTE ONLY): 22 min   Charges:   PT Evaluation $PT Eval Low Complexity: 1 Low          Jailen Coward, PT Acute Rehab [facility] [phone]   Karlton Lemon 06/17/2022, 12:06 PM

## 2022-06-17 NOTE — Progress Notes (Signed)
Pt discharge education and instructions completed with pt and spouse at bedside. Both voices understanding and denies any questions. Pt IV and telemetry removed. Pt discharged home with spouse to transport her home. Pt handed his prescription for Neurontin and to pick up electronically sent valium from preferred pharmacy on file. Pt surgical incision remains unremarkable at discharge. Pt transported off unit via wheelchair with spouse and belongings to the side. Delia Heady RN

## 2022-06-17 NOTE — Plan of Care (Signed)

## 2022-06-17 NOTE — Discharge Instructions (Signed)
Lumbar Discectomy °Care After °A discectomy involves removal of discmaterial (the cartilage-like structures located between the bones of the back). It is done to relieve pressure on nerve roots. It can be used as a treatment for a back problem. The time in surgery depends on the findings in surgery and what is necessary to correct the problems. °HOME CARE INSTRUCTIONS  °· Check the cut (incision) made by the surgeon twice a day for signs of infection. Some signs of infection may include:  °· A foul smelling, greenish or yellowish discharge from the wound.  °· Increased pain.  °· Increased redness over the incision (operative) site.  °· The skin edges may separate.  °· Flu-like symptoms (problems).  °· A temperature above 101.5° F (38.6° C).  °· Change your bandages in about 24 to 36 hours following surgery or as directed.  °· You may shower tomrrow.  Avoid bathtubs, swimming pools and hot tubs for three weeks or until your incision has healed completely. °· Follow your doctor's instructions as to safe activities, exercises, and physical therapy.  °· Weight reduction may be beneficial if you are overweight.  °· Daily exercise is helpful to prevent the return of problems. Walking is permitted. You may use a treadmill without an incline. Cut down on activities and exercise if you have discomfort. You may also go up and down stairs as much as you can tolerate.  °· DO NOT lift anything heavier than 10 to 15 lbs. Avoid bending or twisting at the waist. Always bend your knees when lifting.  °· Maintain strength and range of motion as instructed.  °· Do not drive for 10 days, or as directed by your doctors. You may be a passenger . Lying back in the passenger seat may be more comfortable for you. Always wear a seatbelt.  °· Limit your sitting in a regular chair to 20 to 30 minutes at a time. There are no limitations for sitting in a recliner. You should lie down or walk in between sitting periods.  °· Only take  over-the-counter or prescription medicines for pain, discomfort, or fever as directed by your caregiver.  °SEEK MEDICAL CARE IF:  °· There is increased bleeding (more than a small spot) from the wound.  °· You notice redness, swelling, or increasing pain in the wound.  °· Pus is coming from wound.  °· You develop an unexplained oral temperature above 102° F (38.9° C) develops.  °· You notice a foul smell coming from the wound or dressing.  °· You have increasing pain in your wound.  °SEEK IMMEDIATE MEDICAL CARE IF:  °· You develop a rash.  °· You have difficulty breathing.  °· You develop any allergic problems to medicines given.  °Document Released: 10/09/2004 Document Revised: 10/24/2011 Document Reviewed: 01/28/2008 °ExitCare® Patient Information °

## 2022-06-18 DIAGNOSIS — L570 Actinic keratosis: Secondary | ICD-10-CM | POA: Diagnosis not present

## 2022-06-18 DIAGNOSIS — L821 Other seborrheic keratosis: Secondary | ICD-10-CM | POA: Diagnosis not present

## 2022-06-18 DIAGNOSIS — C4442 Squamous cell carcinoma of skin of scalp and neck: Secondary | ICD-10-CM | POA: Diagnosis not present

## 2022-06-18 DIAGNOSIS — D485 Neoplasm of uncertain behavior of skin: Secondary | ICD-10-CM | POA: Diagnosis not present

## 2022-06-27 ENCOUNTER — Encounter (HOSPITAL_COMMUNITY): Payer: Self-pay

## 2022-06-27 ENCOUNTER — Ambulatory Visit (HOSPITAL_COMMUNITY): Admission: RE | Admit: 2022-06-27 | Payer: Medicare Other | Source: Ambulatory Visit

## 2022-07-10 DIAGNOSIS — M5416 Radiculopathy, lumbar region: Secondary | ICD-10-CM | POA: Diagnosis not present

## 2022-07-16 NOTE — Discharge Summary (Signed)
Physician Discharge Summary  Patient ID: Logan Olson MRN: [id number] DOB/AGE: [birth year] 88 y.o.  Admit date: 06/15/2022 Discharge date: 7/31  Admission Diagnoses:lower extremity pain, weakness  Discharge Diagnoses:  Principal Problem:   Leg weakness, bilateral Active Problems:   Muscle weakness of lower extremity   Discharged Condition: good  Hospital Course: Logan Olson is a 88 y.o. male Admitted yesterday for lower extremity pain. He received steroids, muscle relaxants and worked with PT. He felt better and asked to be discharged on the 31st. He is voiding, ambulating, and tolerating a normal diet at discharge.   Treatments: IV hydration and analgesia: acetaminophen  Discharge Exam: Blood pressure (!) 130/58, pulse 61, temperature 98.6 F (37 C), temperature source Oral, resp. rate 17, height '6\' 2"'$  (1.88 m), weight 115.8 kg, SpO2 96 %. General appearance: alert, cooperative, appears stated age, and no distress  Disposition: Discharge disposition: 01-Home or Self Care      * No surgery found *  Allergies as of 06/17/2022   No Known Allergies      Medication List     TAKE these medications    acetaminophen 650 MG CR tablet Commonly known as: TYLENOL Take 650 mg by mouth every 8 (eight) hours as needed for pain.   amLODipine 5 MG tablet Commonly known as: NORVASC Take 5 mg by mouth daily.   gabapentin 300 MG capsule Commonly known as: NEURONTIN Take 1 capsule (300 mg total) by mouth 3 (three) times daily.   losartan 100 MG tablet Commonly known as: COZAAR Take 100 mg by mouth daily.   Methyl Salicylate-Lido-Menthol 4-4-5 % Ptch Apply 1 patch topically in the morning and at bedtime.   oxyCODONE 5 MG immediate release tablet Commonly known as: Roxicodone Take 1 tablet (5 mg total) by mouth every 4 (four) hours as needed for severe pain.   polyethylene glycol 17 g packet Commonly known as: MiraLax Take 17 g by mouth daily. What changed:   when to take this reasons to take this       ASK your doctor about these medications    diazepam 5 MG tablet Commonly known as: VALIUM Take 1 tablet (5 mg total) by mouth every 6 (six) hours as needed for up to 8 days for anxiety or muscle spasms. Ask about: Should I take this medication?        Follow-up Information     Ashok Pall, MD Follow up.   Specialty: Neurosurgery Why: keep scheduled appointment Contact information: [address] [place] [address] [place] [address] [phone]                 Signed: Ashok Pall 07/16/2022, 6:58 PM

## 2022-07-25 DIAGNOSIS — M4316 Spondylolisthesis, lumbar region: Secondary | ICD-10-CM | POA: Diagnosis not present

## 2022-07-25 DIAGNOSIS — M5126 Other intervertebral disc displacement, lumbar region: Secondary | ICD-10-CM | POA: Diagnosis not present

## 2022-07-25 DIAGNOSIS — Z6831 Body mass index (BMI) 31.0-31.9, adult: Secondary | ICD-10-CM | POA: Diagnosis not present

## 2022-07-28 ENCOUNTER — Emergency Department (HOSPITAL_COMMUNITY)
Admission: EM | Admit: 2022-07-28 | Discharge: 2022-07-28 | Disposition: A | Discharge status: Home / Self Care | Payer: Medicare Other | Attending: Emergency Medicine | Admitting: Emergency Medicine

## 2022-07-28 ENCOUNTER — Other Ambulatory Visit: Payer: Self-pay

## 2022-07-28 ENCOUNTER — Encounter (HOSPITAL_COMMUNITY): Payer: Self-pay | Admitting: *Deleted

## 2022-07-28 DIAGNOSIS — R600 Localized edema: Secondary | ICD-10-CM | POA: Insufficient documentation

## 2022-07-28 DIAGNOSIS — M79604 Pain in right leg: Secondary | ICD-10-CM | POA: Diagnosis present

## 2022-07-28 DIAGNOSIS — Z79899 Other long term (current) drug therapy: Secondary | ICD-10-CM | POA: Insufficient documentation

## 2022-07-28 DIAGNOSIS — R001 Bradycardia, unspecified: Secondary | ICD-10-CM | POA: Diagnosis not present

## 2022-07-28 DIAGNOSIS — M5441 Lumbago with sciatica, right side: Secondary | ICD-10-CM | POA: Diagnosis not present

## 2022-07-28 DIAGNOSIS — M792 Neuralgia and neuritis, unspecified: Secondary | ICD-10-CM

## 2022-07-28 DIAGNOSIS — M5431 Sciatica, right side: Secondary | ICD-10-CM | POA: Diagnosis not present

## 2022-07-28 DIAGNOSIS — R0789 Other chest pain: Secondary | ICD-10-CM | POA: Diagnosis not present

## 2022-07-28 MED ORDER — TIZANIDINE HCL 4 MG PO TABS
4.0000 mg | ORAL_TABLET | Freq: Once | ORAL | Status: AC
Start: 2022-07-28 — End: 2022-07-28
  Administered 2022-07-28: 4 mg via ORAL
  Filled 2022-07-28: qty 1

## 2022-07-28 MED ORDER — LIDOCAINE 4 % EX PTCH: 1.0000 | MEDICATED_PATCH | Freq: Two times a day (BID) | CUTANEOUS | 0 refills | Status: AC

## 2022-07-28 MED ORDER — NAPROXEN 375 MG PO TABS: 375.0000 mg | ORAL_TABLET | Freq: Two times a day (BID) | ORAL | 0 refills | Status: AC

## 2022-07-28 MED ORDER — ACETAMINOPHEN 500 MG PO TABS: 500.0000 mg | ORAL_TABLET | Freq: Four times a day (QID) | ORAL | 0 refills | Status: AC | PRN

## 2022-07-28 MED ORDER — LIDOCAINE 5 % EX PTCH
1.0000 | MEDICATED_PATCH | CUTANEOUS | Status: DC
  Administered 2022-07-28: 1 via TRANSDERMAL
  Filled 2022-07-28: qty 1

## 2022-07-28 MED ORDER — KETOROLAC TROMETHAMINE 60 MG/2ML IM SOLN
30.0000 mg | Freq: Once | INTRAMUSCULAR | Status: AC
  Administered 2022-07-28: 30 mg via INTRAMUSCULAR
  Filled 2022-07-28: qty 2

## 2022-07-28 NOTE — ED Triage Notes (Signed)
Pt c/o worse than normal bilateral leg pain since 2300 last night, sudden onset of chest tightness once this morning and sore throat with difficulty swallowing intermittently since he had back surgery for herniated and bulging disc 4 weeks ago.Pt denies chest pain at this time, only leg and throat pain.

## 2022-07-28 NOTE — Discharge Instructions (Addendum)
We saw you in the emergency room for back pain, leg pain.  It appears that a lot of your pain is nerve mediated in your right leg, likely because of impingement or irritation of the sciatic nerve.  The treatment of this is going to be conservative for now.  We are prescribing you 5 days of anti-inflammatory medication called Naprosyn.  Along with this we would like you to take Tylenol every 6 hours. Our goal will be to see if we can reduce the amount of oxycodone you are taking with improved symptom relief.  Additionally we have prescribed you lidocaine patch, which is a numbing medicine that can be applied to your back. You may also want to consider alternating between hot and cold compresses to your back region.  Some stretching exercises have been provided as well, this is something you need to consider doing when getting better.  Please follow-up with Dr. Willey Blade as soon as possible.  Ask him if physical therapy would be helpful.  Also ask him if gabapentin would be helpful to control the neuropathic pain.

## 2022-07-28 NOTE — ED Provider Notes (Signed)
[facility] EMERGENCY DEPARTMENT Provider Note   CSN: [id number] Arrival date & time: 07/28/22  7341     History  Chief Complaint  Patient presents with   Chest Pain   Leg Pain    Logan Olson is a 88 y.o. male.  HPI     88 year old male comes in primarily for leg pain, back pain. Patient also reports chest tightness and difficulty with swallowing.  All of the patient's symptoms have been present now for at least a month.  Wife is at the bedside.  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  He he had spine surgery completed few weeks back.  He indicates that his back pain, from the bulging disc is a lot better after the surgery, but his pain in the right leg has not improved, in fact it has worsened.  He was told by the surgeon that they might have irritated the sciatic nerve.  He is having pins and needle type pain over his right leg and also intermittently feeling like his leg is burning.  He has been taking oxycodone for his pain over the last several weeks, but he does not like taking the medicine.  He was on steroids at 1 point.  He was on gabapentin as well, however the surgeon asked him to discontinue the gabapentin.  He does not know why gabapentin was discontinued.  He was suggested that he might need further surgery to help with his pain.  Patient did not sleep well last night.  He indicates that he took oxycodone at 11 PM and then again at 4 AM.  The pain was severe enough where he barely slept.  He also indicates that he feels like something is stuck in his throat when he swallows and has had some chest tightness.  The symptoms have also been present for a while.  He denies any chest pain at this time.  Throat pain primarily present with swallowing.  Pt has no associated numbness, weakness, urinary incontinence, urinary retention, bowel incontinence, pins and needle sensation in the perineal area.   Home Medications Prior to Admission medications    Medication Sig Start Date End Date Taking? Authorizing Provider  acetaminophen (TYLENOL) 500 MG tablet Take 1 tablet (500 mg total) by mouth every 6 (six) hours as needed. 07/28/22  Yes Calogero Geisen, MD  lidocaine 4 % Place 1 patch onto the skin 2 (two) times daily. 07/28/22  Yes Justine Dines, MD  naproxen (NAPROSYN) 375 MG tablet Take 1 tablet (375 mg total) by mouth 2 (two) times daily. 07/28/22  Yes Lyric Hoar, MD  amLODipine (NORVASC) 5 MG tablet Take 5 mg by mouth daily. 11/18/17   [provider]  gabapentin (NEURONTIN) 300 MG capsule Take 1 capsule (300 mg total) by mouth 3 (three) times daily. 06/17/22   Ashok Pall, MD  losartan (COZAAR) 100 MG tablet Take 100 mg by mouth daily. 11/01/19   [provider]  Methyl Salicylate-Lido-Menthol 4-4-5 % PTCH Apply 1 patch topically in the morning and at bedtime. 05/18/22   Carmin Muskrat, MD  oxyCODONE (ROXICODONE) 5 MG immediate release tablet Take 1 tablet (5 mg total) by mouth every 4 (four) hours as needed for severe pain. 06/01/22   [name], MD  polyethylene glycol (MIRALAX) 17 g packet Take 17 g by mouth daily. Patient taking differently: Take 17 g by mouth daily as needed for mild constipation. 05/24/22   [name], [name], MD  Allergies    Patient has no known allergies.    Review of Systems   Review of Systems  Physical Exam Updated Vital Signs BP (!) 143/80   Pulse (!) 54   Temp 98 F (36.7 C) (Oral)   Resp 12   Ht '6\' 2"'$  (1.88 m)   Wt 110.2 kg   SpO2 99%   BMI 31.20 kg/m  Physical Exam Vitals and nursing note reviewed.  Constitutional:      Appearance: He is well-developed.  HENT:     Head: Atraumatic.  Cardiovascular:     Rate and Rhythm: Normal rate.  Pulmonary:     Effort: Pulmonary effort is normal.  Musculoskeletal:     Cervical back: Neck supple.     Right lower leg: Edema present.     Left lower leg: Edema present.     Comments: Bilateral pitting edema, 2+  Skin:     General: Skin is warm.  Neurological:     Mental Status: He is alert and oriented to person, place, and time.     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  Procedures Procedures    Medications Ordered in ED Medications  lidocaine (LIDODERM) 5 % 1 patch (1 patch Transdermal Patch Applied 07/28/22 1049)  ketorolac (TORADOL) injection 30 mg (30 mg Intramuscular Given 07/28/22 1049)  tiZANidine (ZANAFLEX) tablet 4 mg (4 mg Oral Given 07/28/22 1049)    ED Course/ Medical Decision Making/ A&P                           Medical Decision Making Amount and/or Complexity of Data Reviewed ECG/medicine tests: ordered.  Risk OTC drugs. Prescription drug management.   This patient presents to the ED with chief complaint(s) of back pain, R leg pain, throat pain, chest tightness with pertinent past medical history of recent spine surgery for herniated disc/disc bulge.patient's primary issue really is the back pain and the leg pain.  There are no red flags suggesting cauda equina.  The complaint involves an extensive differential diagnosis and also carries with it a high risk of complications and morbidity.    The differential diagnosis includes radicular pain/sciatica, and neuropathy. The throat complaint has been present for at least a month.  Could be pill esophagitis. The chest tightness also has been present for a while, there is no pleuritic component or exertional component  to it.  No shortness of breath.  EKG ordered, but this does not appear ACS.  Patient has bilateral pitting edema.  He is on amlodipine and losartan.  States that the pitting edema is not new, but it is worse than usual.  He has not been very mobile.  No history of CHF.  No orthopnea or paroxysmal nocturnal dyspnea.  No rales on exam.  Does not need emergent CHF work-up.  I would not put him on Lasix right now.  Medication side effects, increase sedentary lifestyle could be the cause.  It seems primarily we need to focus on his symptom management.  We will start him on Naprosyn just for 5 days along with Tylenol for 100 mg every 6 hours.  He will take as few oxycodone as possible.  I advised him to see his PCP because there will be some chronicity to his symptoms now and it is unclear why gabapentin was discontinued, if it was working for him.  It seems like once he stopped gabapentin 2 days ago, his pain has worsened.  I discussed with him that normally less medication the better, but if gabapentin is helping him it might not be something he wants to discontinue.  At the same time, to avoid confusion, I asked him to discuss pain control with his PCP who knows in the best.  EKG is not showing any concerning findings.  Additional history obtained: Additional history obtained from spouse Records reviewed previous admission documents and visit with neurosurgery and also MRI of the lumbar spine that was done in July.  [place] Final Clinical Impression(s) / ED Diagnoses Final diagnoses:  Sciatica of right side  Neuropathic pain    Rx / DC Orders ED Discharge Orders          Ordered    naproxen (NAPROSYN) 375 MG tablet  2 times daily        07/28/22 1131    acetaminophen (TYLENOL) 500 MG tablet  Every 6 hours PRN        07/28/22 1131    lidocaine 4 %  2 times daily        07/28/22 1131              Varney Biles, MD 07/28/22 1153

## 2022-07-29 ENCOUNTER — Telehealth: Payer: Self-pay | Admitting: *Deleted

## 2022-07-29 NOTE — Telephone Encounter (Signed)
     Patient  visit on 07/28/2022  at [facility]  was for pain  Have you been able to follow up with your primary care physician? Dr Aretha Parrot to call a prescription for pain meds . The prescription on back order at various pharmacies . [facility] the medicine patient has called theoffice and left a meesage a few times  The patient was not able to obtain any needed medicine or equipment.  Are there diet recommendations that you are having difficulty following?  Patient expresses understanding of discharge instructions and education provided has no other needs at this time.    [place] [phone] [address] , [place] [address] Email : [email or web address]

## 2022-08-07 DIAGNOSIS — H524 Presbyopia: Secondary | ICD-10-CM | POA: Diagnosis not present

## 2022-08-07 DIAGNOSIS — H2513 Age-related nuclear cataract, bilateral: Secondary | ICD-10-CM | POA: Diagnosis not present

## 2022-08-20 DIAGNOSIS — H268 Other specified cataract: Secondary | ICD-10-CM | POA: Diagnosis not present

## 2022-08-27 DIAGNOSIS — H268 Other specified cataract: Secondary | ICD-10-CM | POA: Diagnosis not present

## 2022-09-01 ENCOUNTER — Other Ambulatory Visit: Payer: Self-pay

## 2022-09-01 ENCOUNTER — Encounter (HOSPITAL_COMMUNITY): Payer: Self-pay

## 2022-09-01 ENCOUNTER — Emergency Department (HOSPITAL_COMMUNITY)
Admission: EM | Admit: 2022-09-01 | Discharge: 2022-09-01 | Disposition: A | Discharge status: Home / Self Care | Payer: Medicare Other | Attending: Emergency Medicine | Admitting: Emergency Medicine

## 2022-09-01 DIAGNOSIS — K5903 Drug induced constipation: Secondary | ICD-10-CM | POA: Insufficient documentation

## 2022-09-01 DIAGNOSIS — I10 Essential (primary) hypertension: Secondary | ICD-10-CM | POA: Diagnosis not present

## 2022-09-01 DIAGNOSIS — K5900 Constipation, unspecified: Secondary | ICD-10-CM | POA: Diagnosis present

## 2022-09-01 DIAGNOSIS — Z79899 Other long term (current) drug therapy: Secondary | ICD-10-CM | POA: Diagnosis not present

## 2022-09-01 LAB — CBC WITH DIFFERENTIAL/PLATELET
Abs Immature Granulocytes: 0.03 10*3/uL (ref 0.00–0.07)
Basophils Absolute: 0 10*3/uL (ref 0.0–0.1)
Basophils Relative: 1 %
Eosinophils Absolute: 0.1 10*3/uL (ref 0.0–0.5)
Eosinophils Relative: 3 %
HCT: 37.2 % — ABNORMAL LOW (ref 39.0–52.0)
Hemoglobin: 12.2 g/dL — ABNORMAL LOW (ref 13.0–17.0)
Immature Granulocytes: 1 %
Lymphocytes Relative: 20 %
Lymphs Abs: 0.8 10*3/uL (ref 0.7–4.0)
MCH: 32.5 pg (ref 26.0–34.0)
MCHC: 32.8 g/dL (ref 30.0–36.0)
MCV: 99.2 fL (ref 80.0–100.0)
Monocytes Absolute: 0.4 10*3/uL (ref 0.1–1.0)
Monocytes Relative: 10 %
Neutro Abs: 2.6 10*3/uL (ref 1.7–7.7)
Neutrophils Relative %: 65 %
Platelets: 138 10*3/uL — ABNORMAL LOW (ref 150–400)
RBC: 3.75 MIL/uL — ABNORMAL LOW (ref 4.22–5.81)
RDW: 13.3 % (ref 11.5–15.5)
WBC: 3.9 10*3/uL — ABNORMAL LOW (ref 4.0–10.5)
nRBC: 0 % (ref 0.0–0.2)

## 2022-09-01 LAB — BASIC METABOLIC PANEL
Anion gap: 6 (ref 5–15)
BUN: 15 mg/dL (ref 8–23)
CO2: 26 mmol/L (ref 22–32)
Calcium: 9.3 mg/dL (ref 8.9–10.3)
Chloride: 109 mmol/L (ref 98–111)
Creatinine, Ser: 0.87 mg/dL (ref 0.61–1.24)
GFR, Estimated: >60 mL/min (ref >60)
Glucose, Bld: 103 mg/dL — ABNORMAL HIGH (ref 70–99)
Potassium: 3.9 mmol/L (ref 3.5–5.1)
Sodium: 141 mmol/L (ref 135–145)

## 2022-09-01 MED ORDER — BISACODYL 10 MG RE SUPP
10.0000 mg | Freq: Once | RECTAL | Status: DC
  Filled 2022-09-01: qty 1

## 2022-09-01 NOTE — ED Triage Notes (Signed)
Patient states he has not had a bowel movement in 2-3 days and is straining on the toilet and has noticed blood when he wipes this morning and has slight dripping in toilet. Patient states he is prescribed oxycodone and has constipation issues before. Also complaints of lower abdominal pian.

## 2022-09-01 NOTE — ED Provider Notes (Signed)
[facility] EMERGENCY DEPARTMENT Provider Note   CSN: [id number] Arrival date & time: 09/01/22  0831     History  Chief Complaint  Patient presents with   Constipation    Logan Olson is a 88 y.o. male with a history including hypertension, kidney stones, prostate cancer, and chronic sciatica which is actually worsened since a lumbar laminectomy surgery in July, on chronic oxycodone which has caused problems with chronic constipation.  He has not had a bowel movement in the past 3 days and he has pain at his rectum and bleeding, described as blood on the toilet tissue when he wipes with attempts at passing stool.  He takes Dulcolax every 1 to 2 days which sometimes is helpful but has not been helpful with this event.  He inserted glycerin suppository prior to arrival which has not alleviated his symptoms.   The history is provided by the patient.       Home Medications Prior to Admission medications   Medication Sig Start Date End Date Taking? Authorizing Provider  acetaminophen (TYLENOL) 500 MG tablet Take 1 tablet (500 mg total) by mouth every 6 (six) hours as needed. 07/28/22   Varney Biles, MD  amLODipine (NORVASC) 5 MG tablet Take 5 mg by mouth daily. 11/18/17   [provider]  gabapentin (NEURONTIN) 300 MG capsule Take 1 capsule (300 mg total) by mouth 3 (three) times daily. 06/17/22   Ashok Pall, MD  lidocaine 4 % Place 1 patch onto the skin 2 (two) times daily. 07/28/22   Varney Biles, MD  losartan (COZAAR) 100 MG tablet Take 100 mg by mouth daily. 11/01/19   [provider]  Methyl Salicylate-Lido-Menthol 4-4-5 % PTCH Apply 1 patch topically in the morning and at bedtime. 05/18/22   Carmin Muskrat, MD  naproxen (NAPROSYN) 375 MG tablet Take 1 tablet (375 mg total) by mouth 2 (two) times daily. 07/28/22   Varney Biles, MD  oxyCODONE (ROXICODONE) 5 MG immediate release tablet Take 1 tablet (5 mg total) by mouth every 4 (four) hours as needed for  severe pain. 06/01/22   [name], MD  polyethylene glycol (MIRALAX) 17 g packet Take 17 g by mouth daily. Patient taking differently: Take 17 g by mouth daily as needed for mild constipation. 05/24/22   [name], [name], MD      Allergies    Patient has no known allergies.    Review of Systems   Review of Systems  Constitutional:  Negative for chills and fever.  HENT:  Negative for congestion and sore throat.   Eyes: Negative.   Respiratory:  Negative for chest tightness and shortness of breath.   Cardiovascular:  Negative for chest pain.  Gastrointestinal:  Positive for blood in stool and constipation. Negative for abdominal pain, nausea and vomiting.  Genitourinary: Negative.   Musculoskeletal:  Negative for arthralgias, joint swelling and neck pain.  Skin: Negative.  Negative for rash and wound.  Neurological:  Negative for dizziness, weakness, light-headedness, numbness and headaches.  Psychiatric/Behavioral: Negative.      Physical Exam Updated Vital Signs BP (!) 152/67   Pulse (!) 58   Temp 97.6 F (36.4 C) (Oral)   Resp 18   Ht '6\' 2"'$  (1.88 m)   Wt 108.9 kg   SpO2 96%   BMI 30.81 kg/m  Physical Exam Vitals and nursing note reviewed. Exam conducted with a chaperone present.  Constitutional:      Appearance: He is well-developed.  HENT:  Head: Normocephalic and atraumatic.  Eyes:     Conjunctiva/sclera: Conjunctivae normal.  Cardiovascular:     Rate and Rhythm: Normal rate and regular rhythm.     Heart sounds: Normal heart sounds.  Pulmonary:     Effort: Pulmonary effort is normal.     Breath sounds: Normal breath sounds. No wheezing.  Abdominal:     General: Bowel sounds are normal.     Palpations: Abdomen is soft.     Tenderness: There is no abdominal tenderness. There is no guarding.  Genitourinary:    Rectum: No external hemorrhoid.     Comments: No impaction or stool in rectal vault but examined after pt reports had a large bm after arrival here.   No external hemorrhoid present, no obvious fissure. Musculoskeletal:        General: Normal range of motion.     Cervical back: Normal range of motion.  Skin:    General: Skin is warm and dry.  Neurological:     Mental Status: He is alert.     ED Results / Procedures / Treatments   Labs (all labs ordered are listed, but only abnormal results are displayed) Labs Reviewed  CBC WITH DIFFERENTIAL/PLATELET - Abnormal; Notable for the following components:      Result Value   WBC 3.9 (*)    RBC 3.75 (*)    Hemoglobin 12.2 (*)    HCT 37.2 (*)    Platelets 138 (*)    All other components within normal limits  BASIC METABOLIC PANEL - Abnormal; Notable for the following components:   Glucose, Bld 103 (*)    All other components within normal limits    EKG None  Radiology No results found.  Procedures Procedures    Medications Ordered in ED Medications  bisacodyl (DULCOLAX) suppository 10 mg (has no administration in time range)    ED Course/ Medical Decision Making/ A&P                           Medical Decision Making Patient with a chronic history of constipation, opioid-induced, he takes daily oxycodone secondary to persistent severe right sciatica pain status post lumbar surgery.  He does take Dulcolax every 1 to 2 days which is equivocally helpful.  He had been on MiraLAX in the past but did not like the taste.  I have suggested that he mix this in a juice and make sure he stirs it completely before he drinks, he should not be able to tell he is taking this medicine.  He was willing to have blood work drawn, specifically to make sure his potassium level is normal and it is.  He deferred any imaging test today.  He had a large bowel movement shortly after arrival and had significant relief of his symptoms.  Amount and/or Complexity of Data Reviewed Labs: ordered.    Details: No electrolyte abnormalities.  Risk OTC drugs.           Final Clinical  Impression(s) / ED Diagnoses Final diagnoses:  Drug-induced constipation    Rx / DC Orders ED Discharge Orders     None         Landis Martins 09/01/22 1027    Wyvonnia Dusky, MD 09/01/22 [phone]

## 2022-09-01 NOTE — Discharge Instructions (Signed)
As discussed I strongly suggest MiraLAX in a full 8 ounce glass of fluid of choice at least once daily to keep your stools soft to prevent straining.  You can take occasional Dulcolax but since this is a stimulant laxative it is not recommended to take on a regular basis.  Your lab work today is normal including a normal potassium level.  Plan follow-up with your primary doctor as needed.

## 2022-09-02 DIAGNOSIS — H5789 Other specified disorders of eye and adnexa: Secondary | ICD-10-CM | POA: Diagnosis not present

## 2022-09-02 DIAGNOSIS — I10 Essential (primary) hypertension: Secondary | ICD-10-CM | POA: Diagnosis not present

## 2022-09-02 DIAGNOSIS — F40240 Claustrophobia: Secondary | ICD-10-CM | POA: Diagnosis not present

## 2022-09-02 DIAGNOSIS — H25811 Combined forms of age-related cataract, right eye: Secondary | ICD-10-CM | POA: Diagnosis not present

## 2022-09-02 DIAGNOSIS — Z8546 Personal history of malignant neoplasm of prostate: Secondary | ICD-10-CM | POA: Diagnosis not present

## 2022-09-02 DIAGNOSIS — H2522 Age-related cataract, morgagnian type, left eye: Secondary | ICD-10-CM | POA: Diagnosis not present

## 2022-09-04 DIAGNOSIS — M5416 Radiculopathy, lumbar region: Secondary | ICD-10-CM | POA: Diagnosis not present

## 2022-09-04 DIAGNOSIS — M7918 Myalgia, other site: Secondary | ICD-10-CM | POA: Diagnosis not present

## 2022-09-05 DIAGNOSIS — M4316 Spondylolisthesis, lumbar region: Secondary | ICD-10-CM | POA: Diagnosis not present

## 2022-09-05 DIAGNOSIS — Z6829 Body mass index (BMI) 29.0-29.9, adult: Secondary | ICD-10-CM | POA: Diagnosis not present

## 2022-09-11 ENCOUNTER — Telehealth: Payer: Self-pay

## 2022-09-11 NOTE — Telephone Encounter (Signed)
     Patient  visit on 10/15  at [facility]  Have you been able to follow up with your primary care physician? yes  The patient was or was not able to obtain any needed medicine or equipment. yes  Are there diet recommendations that you are having difficulty following?na  Patient expresses understanding of discharge instructions and education provided has no other needs at this time.  yes    [place], Care Management  [phone] [address], [place], [place] [address] Phone: [phone] Email: [email or web address]

## 2022-09-18 DIAGNOSIS — C4442 Squamous cell carcinoma of skin of scalp and neck: Secondary | ICD-10-CM | POA: Diagnosis not present

## 2022-09-18 DIAGNOSIS — Z85828 Personal history of other malignant neoplasm of skin: Secondary | ICD-10-CM | POA: Diagnosis not present

## 2022-09-18 DIAGNOSIS — D485 Neoplasm of uncertain behavior of skin: Secondary | ICD-10-CM | POA: Diagnosis not present

## 2022-09-25 DIAGNOSIS — R2689 Other abnormalities of gait and mobility: Secondary | ICD-10-CM | POA: Diagnosis not present

## 2022-09-25 DIAGNOSIS — M6281 Muscle weakness (generalized): Secondary | ICD-10-CM | POA: Diagnosis not present

## 2022-09-25 DIAGNOSIS — Z85828 Personal history of other malignant neoplasm of skin: Secondary | ICD-10-CM | POA: Diagnosis not present

## 2022-09-25 DIAGNOSIS — M4316 Spondylolisthesis, lumbar region: Secondary | ICD-10-CM | POA: Diagnosis not present

## 2022-09-25 DIAGNOSIS — C4442 Squamous cell carcinoma of skin of scalp and neck: Secondary | ICD-10-CM | POA: Diagnosis not present

## 2022-09-25 DIAGNOSIS — Y93B9 Activity, other involving muscle strengthening exercises: Secondary | ICD-10-CM | POA: Diagnosis not present

## 2022-09-30 DIAGNOSIS — L0889 Other specified local infections of the skin and subcutaneous tissue: Secondary | ICD-10-CM | POA: Diagnosis not present

## 2022-10-02 DIAGNOSIS — M6281 Muscle weakness (generalized): Secondary | ICD-10-CM | POA: Diagnosis not present

## 2022-10-02 DIAGNOSIS — R2689 Other abnormalities of gait and mobility: Secondary | ICD-10-CM | POA: Diagnosis not present

## 2022-10-02 DIAGNOSIS — Y93B9 Activity, other involving muscle strengthening exercises: Secondary | ICD-10-CM | POA: Diagnosis not present

## 2022-10-02 DIAGNOSIS — M4316 Spondylolisthesis, lumbar region: Secondary | ICD-10-CM | POA: Diagnosis not present

## 2022-10-07 DIAGNOSIS — Y93B9 Activity, other involving muscle strengthening exercises: Secondary | ICD-10-CM | POA: Diagnosis not present

## 2022-10-07 DIAGNOSIS — M4316 Spondylolisthesis, lumbar region: Secondary | ICD-10-CM | POA: Diagnosis not present

## 2022-10-07 DIAGNOSIS — R2689 Other abnormalities of gait and mobility: Secondary | ICD-10-CM | POA: Diagnosis not present

## 2022-10-07 DIAGNOSIS — M6281 Muscle weakness (generalized): Secondary | ICD-10-CM | POA: Diagnosis not present

## 2022-10-09 DIAGNOSIS — M4316 Spondylolisthesis, lumbar region: Secondary | ICD-10-CM | POA: Diagnosis not present

## 2022-10-09 DIAGNOSIS — R2689 Other abnormalities of gait and mobility: Secondary | ICD-10-CM | POA: Diagnosis not present

## 2022-10-09 DIAGNOSIS — M6281 Muscle weakness (generalized): Secondary | ICD-10-CM | POA: Diagnosis not present

## 2022-10-09 DIAGNOSIS — Y93B9 Activity, other involving muscle strengthening exercises: Secondary | ICD-10-CM | POA: Diagnosis not present

## 2022-10-13 ENCOUNTER — Other Ambulatory Visit: Payer: Self-pay

## 2022-10-13 ENCOUNTER — Emergency Department (HOSPITAL_COMMUNITY): Payer: Medicare Other

## 2022-10-13 ENCOUNTER — Emergency Department (HOSPITAL_COMMUNITY)
Admission: EM | Admit: 2022-10-13 | Discharge: 2022-10-13 | Discharge status: Against Medical Advice | Payer: Medicare Other | Attending: Emergency Medicine | Admitting: Emergency Medicine

## 2022-10-13 ENCOUNTER — Encounter (HOSPITAL_COMMUNITY): Payer: Self-pay | Admitting: *Deleted

## 2022-10-13 DIAGNOSIS — R1030 Lower abdominal pain, unspecified: Secondary | ICD-10-CM | POA: Diagnosis present

## 2022-10-13 DIAGNOSIS — K5900 Constipation, unspecified: Secondary | ICD-10-CM | POA: Insufficient documentation

## 2022-10-13 DIAGNOSIS — R109 Unspecified abdominal pain: Secondary | ICD-10-CM | POA: Diagnosis not present

## 2022-10-13 DIAGNOSIS — Z5321 Procedure and treatment not carried out due to patient leaving prior to being seen by health care provider: Secondary | ICD-10-CM | POA: Insufficient documentation

## 2022-10-13 LAB — COMPREHENSIVE METABOLIC PANEL
ALT: 12 U/L (ref 0–44)
AST: 18 U/L (ref 15–41)
Albumin: 4.1 g/dL (ref 3.5–5.0)
Alkaline Phosphatase: 39 U/L (ref 38–126)
Anion gap: 9 (ref 5–15)
BUN: 19 mg/dL (ref 8–23)
CO2: 25 mmol/L (ref 22–32)
Calcium: 9.5 mg/dL (ref 8.9–10.3)
Chloride: 104 mmol/L (ref 98–111)
Creatinine, Ser: 0.86 mg/dL (ref 0.61–1.24)
GFR, Estimated: >60 mL/min (ref >60)
Glucose, Bld: 105 mg/dL — ABNORMAL HIGH (ref 70–99)
Potassium: 3.9 mmol/L (ref 3.5–5.1)
Sodium: 138 mmol/L (ref 135–145)
Total Bilirubin: 2.1 mg/dL — ABNORMAL HIGH (ref 0.3–1.2)
Total Protein: 6.8 g/dL (ref 6.5–8.1)

## 2022-10-13 LAB — CBC WITH DIFFERENTIAL/PLATELET
Abs Immature Granulocytes: 0.06 10*3/uL (ref 0.00–0.07)
Basophils Absolute: 0 10*3/uL (ref 0.0–0.1)
Basophils Relative: 0 %
Eosinophils Absolute: 0.1 10*3/uL (ref 0.0–0.5)
Eosinophils Relative: 1 %
HCT: 36.6 % — ABNORMAL LOW (ref 39.0–52.0)
Hemoglobin: 12.1 g/dL — ABNORMAL LOW (ref 13.0–17.0)
Immature Granulocytes: 1 %
Lymphocytes Relative: 15 %
Lymphs Abs: 1.2 10*3/uL (ref 0.7–4.0)
MCH: 32.2 pg (ref 26.0–34.0)
MCHC: 33.1 g/dL (ref 30.0–36.0)
MCV: 97.3 fL (ref 80.0–100.0)
Monocytes Absolute: 0.5 10*3/uL (ref 0.1–1.0)
Monocytes Relative: 7 %
Neutro Abs: 5.9 10*3/uL (ref 1.7–7.7)
Neutrophils Relative %: 76 %
Platelets: 166 10*3/uL (ref 150–400)
RBC: 3.76 MIL/uL — ABNORMAL LOW (ref 4.22–5.81)
RDW: 13.2 % (ref 11.5–15.5)
WBC: 7.7 10*3/uL (ref 4.0–10.5)
nRBC: 0 % (ref 0.0–0.2)

## 2022-10-13 NOTE — ED Provider Triage Note (Signed)
Emergency Medicine Provider Triage Evaluation Note  Logan Olson , a 88 y.o. male  was evaluated in triage.  Pt complains of constipation in association with lower abdominal pain.  He had a very small hard bowel movement yesterday which did not relieve his symptoms.  He has seen some bright red blood with his stool which she suspects is from straining.  Has a history of prior similar symptoms..  Review of Systems  Positive: Constipation and abdominal pain Negative: Fever, nausea or vomiting  Physical Exam  BP 137/68 (BP Location: Right Arm)   Pulse (!) 50   Temp 97.7 F (36.5 C) (Oral)   Resp 18   Ht '6\' 2"'$  (1.88 m)   Wt 104.3 kg   SpO2 98%   BMI 29.53 kg/m  Gen:   Awake, no distress   Resp:  Normal effort  MSK:   Moves extremities without difficulty  Other:    Medical Decision Making  Medically screening exam initiated at 11:07 AM.  Appropriate orders placed.  Logan Olson was informed that the remainder of the evaluation will be completed by another provider, this initial triage assessment does not replace that evaluation, and the importance of remaining in the ED until their evaluation is complete.  Labs and imaging ordered pending room placement.   Logan Jefferson, PA-C 10/13/22 1108

## 2022-10-13 NOTE — ED Triage Notes (Signed)
Pt states he is constipated; pt last BM yesteryday  Pt has taken glycerin suppositories with no results  Pt states he has noticed bright red blood in stool, he thinks from straining

## 2022-10-14 DIAGNOSIS — R2689 Other abnormalities of gait and mobility: Secondary | ICD-10-CM | POA: Diagnosis not present

## 2022-10-14 DIAGNOSIS — M4316 Spondylolisthesis, lumbar region: Secondary | ICD-10-CM | POA: Diagnosis not present

## 2022-10-14 DIAGNOSIS — Y93B9 Activity, other involving muscle strengthening exercises: Secondary | ICD-10-CM | POA: Diagnosis not present

## 2022-10-14 DIAGNOSIS — M6281 Muscle weakness (generalized): Secondary | ICD-10-CM | POA: Diagnosis not present

## 2022-10-16 DIAGNOSIS — Y93B9 Activity, other involving muscle strengthening exercises: Secondary | ICD-10-CM | POA: Diagnosis not present

## 2022-10-16 DIAGNOSIS — M4316 Spondylolisthesis, lumbar region: Secondary | ICD-10-CM | POA: Diagnosis not present

## 2022-10-16 DIAGNOSIS — R2689 Other abnormalities of gait and mobility: Secondary | ICD-10-CM | POA: Diagnosis not present

## 2022-10-16 DIAGNOSIS — M6281 Muscle weakness (generalized): Secondary | ICD-10-CM | POA: Diagnosis not present

## 2022-10-21 DIAGNOSIS — M6281 Muscle weakness (generalized): Secondary | ICD-10-CM | POA: Diagnosis not present

## 2022-10-21 DIAGNOSIS — Y93B9 Activity, other involving muscle strengthening exercises: Secondary | ICD-10-CM | POA: Diagnosis not present

## 2022-10-21 DIAGNOSIS — R2689 Other abnormalities of gait and mobility: Secondary | ICD-10-CM | POA: Diagnosis not present

## 2022-10-21 DIAGNOSIS — M4316 Spondylolisthesis, lumbar region: Secondary | ICD-10-CM | POA: Diagnosis not present

## 2022-10-23 DIAGNOSIS — Y93B9 Activity, other involving muscle strengthening exercises: Secondary | ICD-10-CM | POA: Diagnosis not present

## 2022-10-23 DIAGNOSIS — M6281 Muscle weakness (generalized): Secondary | ICD-10-CM | POA: Diagnosis not present

## 2022-10-23 DIAGNOSIS — M4316 Spondylolisthesis, lumbar region: Secondary | ICD-10-CM | POA: Diagnosis not present

## 2022-10-23 DIAGNOSIS — R2689 Other abnormalities of gait and mobility: Secondary | ICD-10-CM | POA: Diagnosis not present

## 2022-10-24 DIAGNOSIS — Z23 Encounter for immunization: Secondary | ICD-10-CM | POA: Diagnosis not present

## 2022-10-28 DIAGNOSIS — Y93B9 Activity, other involving muscle strengthening exercises: Secondary | ICD-10-CM | POA: Diagnosis not present

## 2022-10-28 DIAGNOSIS — M6281 Muscle weakness (generalized): Secondary | ICD-10-CM | POA: Diagnosis not present

## 2022-10-28 DIAGNOSIS — R2689 Other abnormalities of gait and mobility: Secondary | ICD-10-CM | POA: Diagnosis not present

## 2022-10-28 DIAGNOSIS — M4316 Spondylolisthesis, lumbar region: Secondary | ICD-10-CM | POA: Diagnosis not present

## 2022-10-30 DIAGNOSIS — M4316 Spondylolisthesis, lumbar region: Secondary | ICD-10-CM | POA: Diagnosis not present

## 2022-10-30 DIAGNOSIS — M6281 Muscle weakness (generalized): Secondary | ICD-10-CM | POA: Diagnosis not present

## 2022-10-30 DIAGNOSIS — R2689 Other abnormalities of gait and mobility: Secondary | ICD-10-CM | POA: Diagnosis not present

## 2022-10-30 DIAGNOSIS — Y93B9 Activity, other involving muscle strengthening exercises: Secondary | ICD-10-CM | POA: Diagnosis not present

## 2022-10-31 DIAGNOSIS — M4316 Spondylolisthesis, lumbar region: Secondary | ICD-10-CM | POA: Diagnosis not present

## 2022-10-31 DIAGNOSIS — M7130 Other bursal cyst, unspecified site: Secondary | ICD-10-CM | POA: Diagnosis not present

## 2022-12-09 DIAGNOSIS — C61 Malignant neoplasm of prostate: Secondary | ICD-10-CM | POA: Diagnosis not present

## 2022-12-09 DIAGNOSIS — Z79899 Other long term (current) drug therapy: Secondary | ICD-10-CM | POA: Diagnosis not present

## 2022-12-09 DIAGNOSIS — I10 Essential (primary) hypertension: Secondary | ICD-10-CM | POA: Diagnosis not present

## 2022-12-09 DIAGNOSIS — R7301 Impaired fasting glucose: Secondary | ICD-10-CM | POA: Diagnosis not present

## 2022-12-17 DIAGNOSIS — M5416 Radiculopathy, lumbar region: Secondary | ICD-10-CM | POA: Diagnosis not present

## 2022-12-17 DIAGNOSIS — I10 Essential (primary) hypertension: Secondary | ICD-10-CM | POA: Diagnosis not present

## 2022-12-23 DIAGNOSIS — M4316 Spondylolisthesis, lumbar region: Secondary | ICD-10-CM | POA: Diagnosis not present

## 2022-12-23 DIAGNOSIS — Z6831 Body mass index (BMI) 31.0-31.9, adult: Secondary | ICD-10-CM | POA: Diagnosis not present

## 2022-12-24 ENCOUNTER — Other Ambulatory Visit: Payer: Self-pay | Admitting: Neurosurgery

## 2022-12-27 NOTE — Progress Notes (Signed)
Surgical Instructions    Your procedure is scheduled on Wednesday, 01/01/23.  Report to [facility] Main Entrance "A" at 12:00 P.M., then check in with the Admitting office.  Call this number if you have problems the morning of surgery:  [phone]   If you have any questions prior to your surgery date call [phone]: Open Monday-Friday 8am-4pm If you experience any cold or flu symptoms such as cough, fever, chills, shortness of breath, etc. between now and your scheduled surgery, please notify us at the above number     Remember:  Do not eat after midnight the night before your surgery  You may drink clear liquids until 11:00am the morning of your surgery.   Clear liquids allowed are: Water, Non-Citrus Juices (without pulp), Carbonated Beverages, Clear Tea, Black Coffee ONLY (NO MILK, CREAM OR POWDERED CREAMER of any kind), and Gatorade    Take these medicines the morning of surgery with A SIP OF WATER:  amLODipine (NORVASC)  gabapentin (NEURONTIN)   IF NEEDED: acetaminophen (TYLENOL)  oxyCODONE (ROXICODONE)   As of today, STOP taking any Aspirin (unless otherwise instructed by your surgeon) Aleve, Naproxen, Ibuprofen, Motrin, Advil, Goody's, BC's, all herbal medications, fish oil, and all vitamins.           Do not wear jewelry or makeup. Do not wear lotions, powders, cologne or deodorant. Men may shave face and neck. Do not bring valuables to the hospital. Do not wear nail polish, gel polish, artificial nails, or any other type of covering on natural nails (fingers and toes) If you have artificial nails or gel coating that need to be removed by a nail salon, please have this removed prior to surgery. Artificial nails or gel coating may interfere with anesthesia's ability to adequately monitor your vital signs.  [facility] is not responsible for any belongings or valuables.    Do NOT Smoke (Tobacco/Vaping)  24 hours prior to your procedure  If you use a CPAP at night,  you may bring your mask for your overnight stay.   Contacts, glasses, hearing aids, dentures or partials may not be worn into surgery, please bring cases for these belongings   For patients admitted to the hospital, discharge time will be determined by your treatment team.   Patients discharged the day of surgery will not be allowed to drive home, and someone needs to stay with them for 24 hours.   SURGICAL WAITING ROOM VISITATION Patients having surgery or a procedure may have no more than 2 support people in the waiting area - these visitors may rotate.   Children under the age of 28 must have an adult with them who is not the patient. If the patient needs to stay at the hospital during part of their recovery, the visitor guidelines for inpatient rooms apply. Pre-op nurse will coordinate an appropriate time for 1 support person to accompany patient in pre-op.  This support person may not rotate.   Please refer to [email or web address] for the visitor guidelines for Inpatients (after your surgery is over and you are in a regular room).    Special instructions:    Oral Hygiene is also important to reduce your risk of infection.  Remember - BRUSH YOUR TEETH THE MORNING OF SURGERY WITH YOUR REGULAR TOOTHPASTE   [facility]- Preparing For Surgery  Before surgery, you can play an important role. Because skin is not sterile, your skin needs to be as free of germs as possible. You can reduce the number of  germs on your skin by washing with CHG (chlorahexidine gluconate) Soap before surgery.  CHG is an antiseptic cleaner which kills germs and bonds with the skin to continue killing germs even after washing.     Please do not use if you have an allergy to CHG or antibacterial soaps. If your skin becomes reddened/irritated stop using the CHG.  Do not shave (including legs and underarms) for at least 48 hours prior to first CHG shower. It is OK to  shave your face.  Please follow these instructions carefully.     Shower the NIGHT BEFORE SURGERY and the MORNING OF SURGERY with CHG Soap.   If you chose to wash your hair, wash your hair first as usual with your normal shampoo. After you shampoo, rinse your hair and body thoroughly to remove the shampoo.  Then [employer] and genitals (private parts) with your normal soap and rinse thoroughly to remove soap.  After that Use CHG Soap as you would any other liquid soap. You can apply CHG directly to the skin and wash gently with a scrungie or a clean washcloth.   Apply the CHG Soap to your body ONLY FROM THE NECK DOWN.  Do not use on open wounds or open sores. Avoid contact with your eyes, ears, mouth and genitals (private parts). Wash Face and genitals (private parts)  with your normal soap.   Wash thoroughly, paying special attention to the area where your surgery will be performed.  Thoroughly rinse your body with warm water from the neck down.  DO NOT shower/wash with your normal soap after using and rinsing off the CHG Soap.  Pat yourself dry with a CLEAN TOWEL.  Wear CLEAN PAJAMAS to bed the night before surgery  Place CLEAN SHEETS on your bed the night before your surgery  DO NOT SLEEP WITH PETS.   Day of Surgery: Take a shower with CHG soap. Wear Clean/Comfortable clothing the morning of surgery Do not apply any deodorants/lotions.   Remember to brush your teeth WITH YOUR REGULAR TOOTHPASTE.    If you received a COVID test during your pre-op visit, it is requested that you wear a mask when out in public, stay away from anyone that may not be feeling well, and notify your surgeon if you develop symptoms. If you have been in contact with anyone that has tested positive in the last 10 days, please notify your surgeon.    Please read over the following fact sheets that you were given.

## 2022-12-30 ENCOUNTER — Encounter (HOSPITAL_COMMUNITY)
Admission: RE | Admit: 2022-12-30 | Discharge: 2022-12-30 | Disposition: A | Discharge status: Home / Self Care | Payer: Medicare Other | Source: Ambulatory Visit | Attending: Neurosurgery | Admitting: Neurosurgery

## 2022-12-30 ENCOUNTER — Other Ambulatory Visit: Payer: Self-pay

## 2022-12-30 ENCOUNTER — Encounter (HOSPITAL_COMMUNITY): Payer: Self-pay

## 2022-12-30 VITALS — BP 137/63 | HR 51 | Temp 98.3°F | Resp 18 | Ht 74.0 in | Wt 246.1 lb

## 2022-12-30 DIAGNOSIS — M5416 Radiculopathy, lumbar region: Secondary | ICD-10-CM | POA: Diagnosis not present

## 2022-12-30 DIAGNOSIS — M4316 Spondylolisthesis, lumbar region: Secondary | ICD-10-CM | POA: Diagnosis not present

## 2022-12-30 DIAGNOSIS — Z01812 Encounter for preprocedural laboratory examination: Secondary | ICD-10-CM | POA: Insufficient documentation

## 2022-12-30 DIAGNOSIS — Z87442 Personal history of urinary calculi: Secondary | ICD-10-CM | POA: Diagnosis not present

## 2022-12-30 DIAGNOSIS — Z01818 Encounter for other preprocedural examination: Secondary | ICD-10-CM

## 2022-12-30 DIAGNOSIS — I2510 Atherosclerotic heart disease of native coronary artery without angina pectoris: Secondary | ICD-10-CM | POA: Insufficient documentation

## 2022-12-30 DIAGNOSIS — Z8546 Personal history of malignant neoplasm of prostate: Secondary | ICD-10-CM | POA: Diagnosis not present

## 2022-12-30 DIAGNOSIS — Z803 Family history of malignant neoplasm of breast: Secondary | ICD-10-CM | POA: Diagnosis not present

## 2022-12-30 DIAGNOSIS — M1990 Unspecified osteoarthritis, unspecified site: Secondary | ICD-10-CM | POA: Diagnosis present

## 2022-12-30 DIAGNOSIS — M4726 Other spondylosis with radiculopathy, lumbar region: Secondary | ICD-10-CM | POA: Diagnosis not present

## 2022-12-30 DIAGNOSIS — M48061 Spinal stenosis, lumbar region without neurogenic claudication: Secondary | ICD-10-CM | POA: Diagnosis present

## 2022-12-30 DIAGNOSIS — M4326 Fusion of spine, lumbar region: Secondary | ICD-10-CM | POA: Diagnosis not present

## 2022-12-30 DIAGNOSIS — I10 Essential (primary) hypertension: Secondary | ICD-10-CM | POA: Diagnosis present

## 2022-12-30 DIAGNOSIS — M4310 Spondylolisthesis, site unspecified: Secondary | ICD-10-CM | POA: Diagnosis not present

## 2022-12-30 LAB — CBC
HCT: 37.2 % — ABNORMAL LOW (ref 39.0–52.0)
Hemoglobin: 12.4 g/dL — ABNORMAL LOW (ref 13.0–17.0)
MCH: 33.6 pg (ref 26.0–34.0)
MCHC: 33.3 g/dL (ref 30.0–36.0)
MCV: 100.8 fL — ABNORMAL HIGH (ref 80.0–100.0)
Platelets: 136 10*3/uL — ABNORMAL LOW (ref 150–400)
RBC: 3.69 MIL/uL — ABNORMAL LOW (ref 4.22–5.81)
RDW: 14.2 % (ref 11.5–15.5)
WBC: 5.4 10*3/uL (ref 4.0–10.5)
nRBC: 0 % (ref 0.0–0.2)

## 2022-12-30 LAB — BASIC METABOLIC PANEL
Anion gap: 8 (ref 5–15)
BUN: 18 mg/dL (ref 8–23)
CO2: 25 mmol/L (ref 22–32)
Calcium: 9.1 mg/dL (ref 8.9–10.3)
Chloride: 107 mmol/L (ref 98–111)
Creatinine, Ser: 1.02 mg/dL (ref 0.61–1.24)
GFR, Estimated: >60 mL/min (ref >60)
Glucose, Bld: 93 mg/dL (ref 70–99)
Potassium: 4.1 mmol/L (ref 3.5–5.1)
Sodium: 140 mmol/L (ref 135–145)

## 2022-12-30 LAB — SURGICAL PCR SCREEN
MRSA, PCR: NEGATIVE
Staphylococcus aureus: NEGATIVE

## 2022-12-30 LAB — TYPE AND SCREEN
ABO/RH(D): A POS
Antibody Screen: NEGATIVE

## 2022-12-30 NOTE — Progress Notes (Signed)
PCP - roy fagan Cardiologist - denies  PPM/ICD - denies   Chest x-ray - n/a EKG - 07/28/22 Stress Test - denies ECHO - denies Cardiac Cath - denies  Sleep Study - denies   ERAS Protcol -yes  COVID TEST- not needed   Anesthesia review: no  Patient denies shortness of breath, fever, cough and chest pain at PAT appointment   All instructions explained to the patient, with a verbal understanding of the material. Patient agrees to go over the instructions while at home for a better understanding. Patient also instructed to self quarantine after being tested for COVID-19. The opportunity to ask questions was provided.

## 2023-01-01 ENCOUNTER — Inpatient Hospital Stay (HOSPITAL_COMMUNITY): Payer: Medicare Other

## 2023-01-01 ENCOUNTER — Encounter (HOSPITAL_COMMUNITY): Payer: Self-pay | Admitting: Neurosurgery

## 2023-01-01 ENCOUNTER — Inpatient Hospital Stay (HOSPITAL_COMMUNITY): Payer: Medicare Other | Admitting: Critical Care Medicine

## 2023-01-01 ENCOUNTER — Encounter (HOSPITAL_COMMUNITY)
Admission: RE | Disposition: A | Discharge status: Home / Self Care | Payer: Self-pay | Source: Home / Self Care | Attending: Neurosurgery

## 2023-01-01 ENCOUNTER — Inpatient Hospital Stay (HOSPITAL_COMMUNITY)
Admission: RE | Admit: 2023-01-01 | Discharge: 2023-01-03 | DRG: 455 | Disposition: A | Discharge status: Home / Self Care | Payer: Medicare Other | Attending: Neurosurgery | Admitting: Neurosurgery

## 2023-01-01 ENCOUNTER — Other Ambulatory Visit: Payer: Self-pay

## 2023-01-01 DIAGNOSIS — Z8546 Personal history of malignant neoplasm of prostate: Secondary | ICD-10-CM

## 2023-01-01 DIAGNOSIS — M4726 Other spondylosis with radiculopathy, lumbar region: Secondary | ICD-10-CM | POA: Diagnosis present

## 2023-01-01 DIAGNOSIS — M4326 Fusion of spine, lumbar region: Secondary | ICD-10-CM | POA: Diagnosis not present

## 2023-01-01 DIAGNOSIS — I10 Essential (primary) hypertension: Secondary | ICD-10-CM | POA: Diagnosis present

## 2023-01-01 DIAGNOSIS — Z87442 Personal history of urinary calculi: Secondary | ICD-10-CM | POA: Diagnosis not present

## 2023-01-01 DIAGNOSIS — M48061 Spinal stenosis, lumbar region without neurogenic claudication: Secondary | ICD-10-CM | POA: Diagnosis present

## 2023-01-01 DIAGNOSIS — M1990 Unspecified osteoarthritis, unspecified site: Secondary | ICD-10-CM | POA: Diagnosis present

## 2023-01-01 DIAGNOSIS — M4316 Spondylolisthesis, lumbar region: Secondary | ICD-10-CM | POA: Diagnosis present

## 2023-01-01 DIAGNOSIS — Z803 Family history of malignant neoplasm of breast: Secondary | ICD-10-CM

## 2023-01-01 DIAGNOSIS — M4310 Spondylolisthesis, site unspecified: Secondary | ICD-10-CM | POA: Diagnosis not present

## 2023-01-01 DIAGNOSIS — M5416 Radiculopathy, lumbar region: Secondary | ICD-10-CM | POA: Diagnosis not present

## 2023-01-01 LAB — ABO/RH: ABO/RH(D): A POS

## 2023-01-01 SURGERY — POSTERIOR LUMBAR FUSION 2 LEVEL
Anesthesia: General | Site: Back

## 2023-01-01 MED ORDER — PHENOL 1.4 % MT LIQD: 1.0000 | OROMUCOSAL | Status: DC | PRN

## 2023-01-01 MED ORDER — SUGAMMADEX SODIUM 200 MG/2ML IV SOLN
INTRAVENOUS | Status: DC | PRN
  Administered 2023-01-01: 200 mg via INTRAVENOUS

## 2023-01-01 MED ORDER — CHLORHEXIDINE GLUCONATE CLOTH 2 % EX PADS: 6.0000 | MEDICATED_PAD | Freq: Once | CUTANEOUS | Status: DC

## 2023-01-01 MED ORDER — THROMBIN 20000 UNITS EX SOLR: CUTANEOUS | Status: DC | PRN

## 2023-01-01 MED ORDER — ACETAMINOPHEN 10 MG/ML IV SOLN
INTRAVENOUS | Status: DC | PRN
  Administered 2023-01-01: 1000 mg via INTRAVENOUS

## 2023-01-01 MED ORDER — ACETAMINOPHEN 650 MG RE SUPP: 650.0000 mg | RECTAL | Status: DC | PRN

## 2023-01-01 MED ORDER — LIDOCAINE-EPINEPHRINE 0.5 %-1:200000 IJ SOLN
INTRAMUSCULAR | Status: DC | PRN
  Administered 2023-01-01: 10 mL

## 2023-01-01 MED ORDER — BISACODYL 5 MG PO TBEC: 5.0000 mg | DELAYED_RELEASE_TABLET | Freq: Every day | ORAL | Status: DC | PRN

## 2023-01-01 MED ORDER — MENTHOL 3 MG MT LOZG: 1.0000 | LOZENGE | OROMUCOSAL | Status: DC | PRN

## 2023-01-01 MED ORDER — ALBUMIN HUMAN 5 % IV SOLN: INTRAVENOUS | Status: DC | PRN

## 2023-01-01 MED ORDER — PROPOFOL 10 MG/ML IV BOLUS
INTRAVENOUS | Status: AC
  Filled 2023-01-01: qty 20

## 2023-01-01 MED ORDER — MORPHINE SULFATE (PF) 2 MG/ML IV SOLN: 1.0000 mg | INTRAVENOUS | Status: DC | PRN

## 2023-01-01 MED ORDER — LIDOCAINE 2% (20 MG/ML) 5 ML SYRINGE
INTRAMUSCULAR | Status: DC | PRN
  Administered 2023-01-01: 40 mg via INTRAVENOUS

## 2023-01-01 MED ORDER — ACETAMINOPHEN 10 MG/ML IV SOLN: 1000.0000 mg | Freq: Once | INTRAVENOUS | Status: DC | PRN

## 2023-01-01 MED ORDER — EPHEDRINE SULFATE-NACL 50-0.9 MG/10ML-% IV SOSY
PREFILLED_SYRINGE | INTRAVENOUS | Status: DC | PRN
  Administered 2023-01-01 (×2): 2.5 mg via INTRAVENOUS

## 2023-01-01 MED ORDER — ONDANSETRON HCL 4 MG PO TABS: 4.0000 mg | ORAL_TABLET | Freq: Four times a day (QID) | ORAL | Status: DC | PRN

## 2023-01-01 MED ORDER — OXYCODONE HCL 5 MG PO TABS: 10.0000 mg | ORAL_TABLET | ORAL | Status: DC | PRN

## 2023-01-01 MED ORDER — CEFAZOLIN SODIUM-DEXTROSE 2-4 GM/100ML-% IV SOLN
2.0000 g | INTRAVENOUS | Status: AC
  Administered 2023-01-01 (×2): 2 g via INTRAVENOUS
  Filled 2023-01-01: qty 100

## 2023-01-01 MED ORDER — MEPERIDINE HCL 25 MG/ML IJ SOLN: 6.2500 mg | INTRAMUSCULAR | Status: DC | PRN

## 2023-01-01 MED ORDER — ACETAMINOPHEN 160 MG/5ML PO SOLN: 325.0000 mg | Freq: Once | ORAL | Status: DC | PRN

## 2023-01-01 MED ORDER — POTASSIUM CHLORIDE IN NACL 20-0.9 MEQ/L-% IV SOLN: INTRAVENOUS | Status: DC

## 2023-01-01 MED ORDER — BUPIVACAINE HCL (PF) 0.5 % IJ SOLN
INTRAMUSCULAR | Status: AC
  Filled 2023-01-01: qty 30

## 2023-01-01 MED ORDER — LIDOCAINE 2% (20 MG/ML) 5 ML SYRINGE
INTRAMUSCULAR | Status: AC
  Filled 2023-01-01: qty 10

## 2023-01-01 MED ORDER — ONDANSETRON HCL 4 MG/2ML IJ SOLN
4.0000 mg | Freq: Four times a day (QID) | INTRAMUSCULAR | Status: DC | PRN
  Administered 2023-01-02: 4 mg via INTRAVENOUS
  Filled 2023-01-01: qty 2

## 2023-01-01 MED ORDER — ALUM & MAG HYDROXIDE-SIMETH 200-200-20 MG/5ML PO SUSP: 30.0000 mL | Freq: Four times a day (QID) | ORAL | Status: DC | PRN

## 2023-01-01 MED ORDER — AMISULPRIDE (ANTIEMETIC) 5 MG/2ML IV SOLN: 10.0000 mg | Freq: Once | INTRAVENOUS | Status: DC | PRN

## 2023-01-01 MED ORDER — 0.9 % SODIUM CHLORIDE (POUR BTL) OPTIME
TOPICAL | Status: DC | PRN
  Administered 2023-01-01: 1000 mL

## 2023-01-01 MED ORDER — ORAL CARE MOUTH RINSE: 15.0000 mL | Freq: Once | OROMUCOSAL | Status: AC

## 2023-01-01 MED ORDER — GLYCOPYRROLATE PF 0.2 MG/ML IJ SOSY
PREFILLED_SYRINGE | INTRAMUSCULAR | Status: DC | PRN
  Administered 2023-01-01: .2 mg via INTRAVENOUS

## 2023-01-01 MED ORDER — SODIUM CHLORIDE 0.9% FLUSH
3.0000 mL | Freq: Two times a day (BID) | INTRAVENOUS | Status: DC
  Administered 2023-01-02 (×2): 3 mL via INTRAVENOUS

## 2023-01-01 MED ORDER — LACTATED RINGERS IV SOLN: INTRAVENOUS | Status: DC

## 2023-01-01 MED ORDER — BUPIVACAINE HCL (PF) 0.5 % IJ SOLN
INTRAMUSCULAR | Status: DC | PRN
  Administered 2023-01-01: 30 mL

## 2023-01-01 MED ORDER — CHLORHEXIDINE GLUCONATE 0.12 % MT SOLN
15.0000 mL | Freq: Once | OROMUCOSAL | Status: AC
  Administered 2023-01-01: 15 mL via OROMUCOSAL
  Filled 2023-01-01: qty 15

## 2023-01-01 MED ORDER — PROMETHAZINE HCL 25 MG/ML IJ SOLN: 6.2500 mg | INTRAMUSCULAR | Status: DC | PRN

## 2023-01-01 MED ORDER — ACETAMINOPHEN 325 MG PO TABS: 650.0000 mg | ORAL_TABLET | ORAL | Status: DC | PRN

## 2023-01-01 MED ORDER — DIAZEPAM 5 MG PO TABS
5.0000 mg | ORAL_TABLET | Freq: Four times a day (QID) | ORAL | Status: DC | PRN
  Administered 2023-01-02 (×2): 5 mg via ORAL
  Filled 2023-01-01 (×2): qty 1

## 2023-01-01 MED ORDER — SENNOSIDES-DOCUSATE SODIUM 8.6-50 MG PO TABS: 1.0000 | ORAL_TABLET | Freq: Every evening | ORAL | Status: DC | PRN

## 2023-01-01 MED ORDER — DEXAMETHASONE SODIUM PHOSPHATE 10 MG/ML IJ SOLN
INTRAMUSCULAR | Status: AC
  Filled 2023-01-01: qty 2

## 2023-01-01 MED ORDER — ROCURONIUM BROMIDE 10 MG/ML (PF) SYRINGE
PREFILLED_SYRINGE | INTRAVENOUS | Status: DC | PRN
  Administered 2023-01-01 (×3): 20 mg via INTRAVENOUS
  Administered 2023-01-01: 60 mg via INTRAVENOUS

## 2023-01-01 MED ORDER — ACETAMINOPHEN 10 MG/ML IV SOLN
INTRAVENOUS | Status: AC
  Filled 2023-01-01: qty 100

## 2023-01-01 MED ORDER — GLYCOPYRROLATE PF 0.2 MG/ML IJ SOSY
PREFILLED_SYRINGE | INTRAMUSCULAR | Status: AC
  Filled 2023-01-01: qty 1

## 2023-01-01 MED ORDER — HYDROMORPHONE HCL 1 MG/ML IJ SOLN
0.2500 mg | INTRAMUSCULAR | Status: DC | PRN
  Administered 2023-01-01 (×4): 0.5 mg via INTRAVENOUS

## 2023-01-01 MED ORDER — ACETAMINOPHEN 325 MG PO TABS: 325.0000 mg | ORAL_TABLET | Freq: Once | ORAL | Status: DC | PRN

## 2023-01-01 MED ORDER — SODIUM CHLORIDE 0.9 % IV SOLN: 250.0000 mL | INTRAVENOUS | Status: DC

## 2023-01-01 MED ORDER — MAGNESIUM CITRATE PO SOLN: 1.0000 | Freq: Once | ORAL | Status: DC | PRN

## 2023-01-01 MED ORDER — CEFAZOLIN SODIUM-DEXTROSE 1-4 GM/50ML-% IV SOLN
1.0000 g | Freq: Three times a day (TID) | INTRAVENOUS | Status: AC
  Administered 2023-01-02 (×2): 1 g via INTRAVENOUS
  Filled 2023-01-01 (×2): qty 50

## 2023-01-01 MED ORDER — DEXAMETHASONE SODIUM PHOSPHATE 10 MG/ML IJ SOLN
INTRAMUSCULAR | Status: DC | PRN
  Administered 2023-01-01: 4 mg via INTRAVENOUS

## 2023-01-01 MED ORDER — ONDANSETRON HCL 4 MG/2ML IJ SOLN
INTRAMUSCULAR | Status: AC
  Filled 2023-01-01: qty 4

## 2023-01-01 MED ORDER — FENTANYL CITRATE (PF) 250 MCG/5ML IJ SOLN
INTRAMUSCULAR | Status: DC | PRN
  Administered 2023-01-01: 50 ug via INTRAVENOUS
  Administered 2023-01-01: 100 ug via INTRAVENOUS
  Administered 2023-01-01 (×2): 50 ug via INTRAVENOUS

## 2023-01-01 MED ORDER — ACETAMINOPHEN 500 MG PO TABS
1000.0000 mg | ORAL_TABLET | Freq: Four times a day (QID) | ORAL | Status: AC
  Administered 2023-01-02 (×4): 1000 mg via ORAL
  Filled 2023-01-01 (×4): qty 2

## 2023-01-01 MED ORDER — PREGABALIN 25 MG PO CAPS
50.0000 mg | ORAL_CAPSULE | Freq: Two times a day (BID) | ORAL | Status: DC
  Administered 2023-01-01 – 2023-01-03 (×4): 50 mg via ORAL
  Filled 2023-01-01 (×4): qty 2

## 2023-01-01 MED ORDER — SODIUM CHLORIDE 0.9% FLUSH: 3.0000 mL | INTRAVENOUS | Status: DC | PRN

## 2023-01-01 MED ORDER — LIDOCAINE-EPINEPHRINE 0.5 %-1:200000 IJ SOLN
INTRAMUSCULAR | Status: AC
  Filled 2023-01-01: qty 1

## 2023-01-01 MED ORDER — FENTANYL CITRATE (PF) 250 MCG/5ML IJ SOLN
INTRAMUSCULAR | Status: AC
  Filled 2023-01-01: qty 5

## 2023-01-01 MED ORDER — PHENYLEPHRINE HCL-NACL 20-0.9 MG/250ML-% IV SOLN
INTRAVENOUS | Status: DC | PRN
  Administered 2023-01-01: 30 ug/min via INTRAVENOUS

## 2023-01-01 MED ORDER — HYDROMORPHONE HCL 1 MG/ML IJ SOLN
INTRAMUSCULAR | Status: AC
  Filled 2023-01-01: qty 1

## 2023-01-01 MED ORDER — CEFAZOLIN SODIUM 1 G IJ SOLR
INTRAMUSCULAR | Status: AC
  Filled 2023-01-01: qty 20

## 2023-01-01 MED ORDER — SENNA 8.6 MG PO TABS
1.0000 | ORAL_TABLET | Freq: Two times a day (BID) | ORAL | Status: DC
  Administered 2023-01-01 – 2023-01-03 (×4): 8.6 mg via ORAL
  Filled 2023-01-01 (×4): qty 1

## 2023-01-01 MED ORDER — ROCURONIUM BROMIDE 10 MG/ML (PF) SYRINGE
PREFILLED_SYRINGE | INTRAVENOUS | Status: AC
  Filled 2023-01-01: qty 20

## 2023-01-01 MED ORDER — OXYCODONE HCL 5 MG PO TABS
5.0000 mg | ORAL_TABLET | ORAL | Status: DC | PRN
  Administered 2023-01-01 – 2023-01-02 (×3): 5 mg via ORAL
  Filled 2023-01-01 (×3): qty 1

## 2023-01-01 MED ORDER — OXYCODONE HCL ER 10 MG PO T12A
10.0000 mg | EXTENDED_RELEASE_TABLET | Freq: Two times a day (BID) | ORAL | Status: DC
  Administered 2023-01-01 – 2023-01-03 (×4): 10 mg via ORAL
  Filled 2023-01-01 (×4): qty 1

## 2023-01-01 MED ORDER — THROMBIN 20000 UNITS EX SOLR
CUTANEOUS | Status: AC
  Filled 2023-01-01: qty 20000

## 2023-01-01 MED ORDER — PROPOFOL 10 MG/ML IV BOLUS
INTRAVENOUS | Status: DC | PRN
  Administered 2023-01-01: 120 mg via INTRAVENOUS

## 2023-01-01 MED ORDER — ONDANSETRON HCL 4 MG/2ML IJ SOLN
INTRAMUSCULAR | Status: DC | PRN
  Administered 2023-01-01: 4 mg via INTRAVENOUS

## 2023-01-01 SURGICAL SUPPLY — 71 items
BAG COUNTER SPONGE SURGICOUNT (BAG) ×1 IMPLANT
BASKET BONE COLLECTION (BASKET) ×1 IMPLANT
BENZOIN TINCTURE PRP APPL 2/3 (GAUZE/BANDAGES/DRESSINGS) IMPLANT
BIT DRILL PLIF MAS DISP 5.5MM (DRILL) IMPLANT
BLADE BONE MILL MEDIUM (MISCELLANEOUS) ×1 IMPLANT
BLADE CLIPPER SURG (BLADE) IMPLANT
BUR MATCHSTICK NEURO 3.0 LAGG (BURR) ×1 IMPLANT
BUR PRECISION FLUTE 5.0 (BURR) ×1 IMPLANT
CAGE POR CASCADIA 8.5X28X10 (Cage) IMPLANT
CANISTER SUCT 3000ML PPV (MISCELLANEOUS) ×1 IMPLANT
CAP RELINE MOD TULIP RMM (Cap) IMPLANT
CASCADIA AN CONVEX 8.5X28X11 (Cage) ×1 IMPLANT
CNTNR URN SCR LID CUP LEK RST (MISCELLANEOUS) ×1 IMPLANT
CONT SPEC 4OZ STRL OR WHT (MISCELLANEOUS) ×1
COVER BACK TABLE 60X90IN (DRAPES) IMPLANT
DERMABOND ADVANCED .7 DNX12 (GAUZE/BANDAGES/DRESSINGS) ×1 IMPLANT
DERMABOND ADVANCED .7 DNX6 (GAUZE/BANDAGES/DRESSINGS) IMPLANT
DRAPE C-ARM 42X72 X-RAY (DRAPES) ×2 IMPLANT
DRAPE C-ARMOR (DRAPES) IMPLANT
DRAPE LAPAROTOMY 100X72X124 (DRAPES) ×1 IMPLANT
DRAPE SURG 17X23 STRL (DRAPES) ×1 IMPLANT
DRILL PLIF MAS DISP 5.5MM (DRILL) ×1
DRSG OPSITE POSTOP 4X8 (GAUZE/BANDAGES/DRESSINGS) IMPLANT
DURAPREP 26ML APPLICATOR (WOUND CARE) ×1 IMPLANT
ELECT BLADE 4.0 EZ CLEAN MEGAD (MISCELLANEOUS) ×1
ELECT REM PT RETURN 9FT ADLT (ELECTROSURGICAL) ×1
ELECTRODE BLDE 4.0 EZ CLN MEGD (MISCELLANEOUS) IMPLANT
ELECTRODE REM PT RTRN 9FT ADLT (ELECTROSURGICAL) ×1 IMPLANT
GAUZE 4X4 16PLY ~~LOC~~+RFID DBL (SPONGE) IMPLANT
GAUZE SPONGE 4X4 12PLY STRL (GAUZE/BANDAGES/DRESSINGS) IMPLANT
GLOVE ECLIPSE 6.5 STRL STRAW (GLOVE) ×2 IMPLANT
GLOVE EXAM NITRILE XL STR (GLOVE) IMPLANT
GOWN STRL REUS W/ TWL LRG LVL3 (GOWN DISPOSABLE) ×2 IMPLANT
GOWN STRL REUS W/ TWL XL LVL3 (GOWN DISPOSABLE) IMPLANT
GOWN STRL REUS W/TWL 2XL LVL3 (GOWN DISPOSABLE) IMPLANT
GOWN STRL REUS W/TWL LRG LVL3 (GOWN DISPOSABLE) ×2
GOWN STRL REUS W/TWL XL LVL3 (GOWN DISPOSABLE)
GRAFT BONE PROTEIOS SM 1CC (Orthopedic Implant) IMPLANT
INTERBODY CSCD AN CVX8.5X28X11 (Cage) IMPLANT
KIT BASIN OR (CUSTOM PROCEDURE TRAY) ×1 IMPLANT
KIT POSITION SURG JACKSON T1 (MISCELLANEOUS) ×1 IMPLANT
KIT TURNOVER KIT B (KITS) ×1 IMPLANT
MILL BONE PREP (MISCELLANEOUS) IMPLANT
NDL HYPO 18GX1.5 BLUNT FILL (NEEDLE) IMPLANT
NDL HYPO 25X1 1.5 SAFETY (NEEDLE) ×1 IMPLANT
NDL SPNL 18GX3.5 QUINCKE PK (NEEDLE) IMPLANT
NEEDLE HYPO 18GX1.5 BLUNT FILL (NEEDLE) ×1 IMPLANT
NEEDLE HYPO 25X1 1.5 SAFETY (NEEDLE) ×1 IMPLANT
NEEDLE SPNL 18GX3.5 QUINCKE PK (NEEDLE) IMPLANT
NS IRRIG 1000ML POUR BTL (IV SOLUTION) ×1 IMPLANT
PACK LAMINECTOMY NEURO (CUSTOM PROCEDURE TRAY) ×1 IMPLANT
PAD ARMBOARD 7.5X6 YLW CONV (MISCELLANEOUS) ×3 IMPLANT
PLATE AFFIX 35MM (Plate) IMPLANT
PLATE AFFIX 45MM (Plate) IMPLANT
PLATE AFFIX 55MM (Plate) IMPLANT
ROD RELINE COCR 5.0X60MM (Rod) IMPLANT
SCREW LOCK RSS 4.5/5.0MM (Screw) IMPLANT
SCREW SHANK RELINE MOD 5.5X35 (Screw) IMPLANT
SCREW SHANK RELINE MOD 6.5X35 (Screw) IMPLANT
SPIKE FLUID TRANSFER (MISCELLANEOUS) ×1 IMPLANT
SPONGE SURGIFOAM ABS GEL 100 (HEMOSTASIS) ×1 IMPLANT
SPONGE T-LAP 4X18 ~~LOC~~+RFID (SPONGE) IMPLANT
STRIP CLOSURE SKIN 1/2X4 (GAUZE/BANDAGES/DRESSINGS) IMPLANT
SUT PROLENE 6 0 BV (SUTURE) IMPLANT
SUT VIC AB 0 CT1 18XCR BRD8 (SUTURE) ×1 IMPLANT
SUT VIC AB 0 CT1 8-18 (SUTURE) ×2
SUT VIC AB 2-0 CT1 18 (SUTURE) ×1 IMPLANT
SUT VIC AB 3-0 SH 8-18 (SUTURE) ×1 IMPLANT
TOWEL GREEN STERILE (TOWEL DISPOSABLE) ×1 IMPLANT
TOWEL GREEN STERILE FF (TOWEL DISPOSABLE) ×1 IMPLANT
WATER STERILE IRR 1000ML POUR (IV SOLUTION) ×1 IMPLANT

## 2023-01-01 NOTE — Anesthesia Procedure Notes (Signed)
Procedure Name: Intubation Date/Time: 01/01/2023 2:50 PM  Performed by: Wilburn Cornelia, CRNAPre-anesthesia Checklist: Patient identified, Emergency Drugs available, Suction available, Patient being monitored and Timeout performed Patient Re-evaluated:Patient Re-evaluated prior to induction Oxygen Delivery Method: Circle system utilized Preoxygenation: Pre-oxygenation with 100% oxygen Induction Type: IV induction Ventilation: Mask ventilation without difficulty Laryngoscope Size: Mac and 4 Grade View: Grade I Tube type: Oral Tube size: 7.5 mm Number of attempts: 1 Airway Equipment and Method: Stylet Placement Confirmation: ETT inserted through vocal cords under direct vision, positive ETCO2, CO2 detector and breath sounds checked- equal and bilateral Secured at: 23 cm Tube secured with: Tape Dental Injury: Teeth and Oropharynx as per pre-operative assessment

## 2023-01-01 NOTE — Op Note (Signed)
01/01/2023  8:23 PM  PATIENT:  Logan Olson  89 y.o. male  PRE-OPERATIVE DIAGNOSIS:  SpondylolisthesisL3/4, 4/5 Lumbar L3, L4, L5 radiculopathy right  Lumbar spondylosis with radi POST-OPERATIVE DIAGNOSIS:  Spondylolisthesis L3/4,4/5 Lumbar spondylosis L3/4,4/5 with radiculopathy  PROCEDURE:  Procedure(s): Lumbar Three-Four, Lumbar Four-Five Posterior Lumbar Interbody Fusion,  Posterolateral arthrodesis L3-5 autograft morsels Spinous process plating L3/4, 4/5 Lumbar Three - Four Hemilaminectomy and Decompression on right side  SURGEON:  Surgeon(s): Ashok Pall, MD Vallarie Mare, MD  ASSISTANTS:Thomas, Roderic Palau  ANESTHESIA:   general  EBL:  Total I/O In: 500 [I.V.:500] Out: 100 [Urine:50; Blood:50]  BLOOD ADMINISTERED:none  CELL SAVER GIVEN:none  COUNT:per nursing  DRAINS: (1) Hemovact drain(s) in the subfascial space with  Suction Open   SPECIMEN:  No Specimen  DICTATION: Logan Olson is a 89 y.o. male whom was taken to the operating room intubated, and placed under a general anesthetic without difficulty. A foley catheter was placed under sterile conditions. He was positioned prone on a Jackson table with all pressure points properly padded.  HIs lumbar region was prepped and draped in a sterile manner. I infiltrated 20cc's 1/2%lidocaine/1:2000,000 strength epinephrine into the planned incision. I opened the skin with a 10 blade and took the incision down to the thoracolumbar fascia. I exposed the lamina of L2,3,4,and 5 in a subperiosteal fashion bilaterally. I confirmed my location with an intraoperative xray.  I placed self retaining retractors and started the decompression.  I decompressed the spinal canal via an L3, and L4 hemilaminectomies. I performed right inferior facetectomies of L3 and L4, and partial superior facetectomies of L4 and L5. I decompress the lateral recess, and neural foramina to allow free egress for the L3, and L4, roots. PLIF's were  performed at L3/4, and 4/5 in the same fashion. I opened the disc space with a 15 blade then used a variety of instruments to remove the disc and prepare the space for the arthrodesis. I used curettes, rongeurs, punches, shavers for the disc space, and rasps in the discetomy. I measured the disc space and placed 2 titanium cages(Stryker) into the disc space(s). Filled with auto graft morsels, and Procell I decorticated the lateral bone at L4/5. I then placed autograft morsels on the decorticated surfaces to complete the posterolateral arthrodesis.  I placed pedicle screws at L3,4,and 5, on the right side only using fluoroscopic guidance. I drilled a pilot hole, then cannulated the pedicle with a drill at each site. I then tapped each pedicle, assessing each site for pedicle violations. No cutouts were appreciated. Screws (Nuvasive mas plif) were then placed at each site without difficulty. I attached rods and locking caps with the appropriate tools. The locking caps were secured with torque limited screwdrivers. Final films were performed and the final construct appeared to be in good position.  I placed spinous process clamps at L3/4 and L4/5  I then placed a medium hemovac drain in the subfascial space and brought the drain tube out through the skin and attached it to the hemovac device. O closed the wound in a layered fashion. I approximated the thoracolumbar fascia, subcutaneous, and subcuticular planes with vicryl sutures.  I used dermaboned, and an occlusive bandage for a sterile dressing.     PLAN OF CARE: Admit to inpatient   PATIENT DISPOSITION:  PACU - hemodynamically stable.   Delay start of Pharmacological VTE agent (>24hrs) due to surgical blood loss or risk of bleeding:  yes

## 2023-01-01 NOTE — H&P (Signed)
BP (!) 140/65   Pulse (!) 52   Temp 97.6 F (36.4 C) (Oral)   Resp 17   Ht 6' 2"$  (1.88 m)   Wt 109.3 kg   SpO2 99%   BMI 30.94 kg/m  Logan Olson returns today.  He has done 6 weeks of physical therapy without improvement.  Leg still feels like jelly.  It is weak.  It hurts.  He cannot sleep through the night.  We have had 7 months of watching.  We have had EMGs that show some denervation.  We have had physical therapy, conservative treatment, medication, none of which have helped.  At this point, given that he is not improving as he was 1 time previously, I will go ahead and proceed with an interbody arthrodesis at 3-4 and at 4-5.  He has no discomfort on the left side.  While hardware might be placed via pedicle screws, I am not going to do any canal work on the left side there.  Will just do 3-4 and 4-5, cages and screws.  If I can find spinous process  that can layer so I can encompass both levels, I think I might just do that.  At age of 34, I will use BMP to help with the arthrodesis, but I think at this point, given the weakness and lack of response to all other measures, that operative decompression and arthrodesis is a reasonable choice.  I have explained that, he agrees.  I will see him sometime after Christmas for the operation. No Known Allergies Past Medical History:  Diagnosis Date   Arthritis    History of kidney stones    Hypertension    Prostate cancer ([place]) dx'd 07/13/18   Past Surgical History:  Procedure Laterality Date   EYE SURGERY     cataract removal on left eye   LUMBAR LAMINECTOMY/DECOMPRESSION MICRODISCECTOMY N/A 05/31/2022   Procedure: MICRODISCECTOMY LUMBAR THREE-FOUR RIGHT LUMBAR FOUR-FIVE;  Surgeon: Ashok Pall, MD;  Location: [facility] [facility];  Service: Neurosurgery;  Laterality: N/A;   PROSTATE BIOPSY     Family History  Problem Relation Age of Onset   Breast cancer Sister    Colon cancer Neg Hx    Pancreatic cancer Neg Hx    Prostate cancer Neg Hx    Physical Exam Constitutional:      Appearance: Normal appearance.  HENT:     Head: Normocephalic and atraumatic.     Nose: Nose normal.     Mouth/Throat:     Mouth: Mucous membranes are moist.     Pharynx: Oropharynx is clear.  Eyes:     Extraocular Movements: Extraocular movements intact.     Pupils: Pupils are equal, round, and reactive to light.  Cardiovascular:     Rate and Rhythm: Normal rate.  Pulmonary:     Effort: Pulmonary effort is normal.  Abdominal:     General: Abdomen is flat.  Musculoskeletal:        General: Normal range of motion.     Cervical back: Normal range of motion.  Skin:    General: Skin is warm and dry.  Neurological:     General: No focal deficit present.     Mental Status: He is alert and oriented to person, place, and time.     Cranial Nerves: Cranial nerves 2-12 are intact.     Motor: Motor function is intact.     Coordination: Coordination is intact.     Gait: Gait is intact.  Psychiatric:  Mood and Affect: Mood normal.        Behavior: Behavior normal.        Thought Content: Thought content normal.        Judgment: Judgment normal.     Social History   Socioeconomic History   Marital status: Married    Spouse name: Not on file   Number of children: Not on file   Years of education: Not on file   Highest education level: Not on file  Occupational History   Not on file  Tobacco Use   Smoking status: Never   Smokeless tobacco: Never  Vaping Use   Vaping Use: Never used  Substance and Sexual Activity   Alcohol use: Not Currently    Comment: social   Drug use: Never   Sexual activity: Not Currently  Other Topics Concern   Not on file  Social History Narrative   Not on file   Social Determinants of Health   Financial Resource Strain: Not on file  Food Insecurity: Not on file  Transportation Needs: Not on file  Physical Activity: Not on file  Stress: Not on file  Social Connections: Not on file  Intimate Partner  Violence: Not on file   Prior to Admission medications   Medication Sig Start Date End Date Taking? Authorizing Provider  acetaminophen (TYLENOL) 500 MG tablet Take 1 tablet (500 mg total) by mouth every 6 (six) hours as needed. Patient taking differently: Take 1,000 mg by mouth as needed for moderate pain or mild pain. 07/28/22  Yes Nanavati, Ankit, MD  amLODipine (NORVASC) 5 MG tablet Take 5 mg by mouth daily. 11/18/17  Yes [provider]  ibuprofen (ADVIL) 200 MG tablet Take 400 mg by mouth as needed for moderate pain or mild pain.   Yes [provider]  losartan (COZAAR) 100 MG tablet Take 100 mg by mouth daily. 11/01/19  Yes [provider]  oxyCODONE (ROXICODONE) 5 MG immediate release tablet Take 1 tablet (5 mg total) by mouth every 4 (four) hours as needed for severe pain. Patient taking differently: Take 5 mg by mouth as needed for severe pain. 06/01/22  Yes [name], MD  polyethylene glycol (MIRALAX) 17 g packet Take 17 g by mouth daily. Patient taking differently: Take 17 g by mouth daily as needed for mild constipation. 05/24/22  Yes [name], Madison, MD  pregabalin (LYRICA) 50 MG capsule Take 50 mg by mouth 2 (two) times daily.   Yes [provider]  gabapentin (NEURONTIN) 300 MG capsule Take 1 capsule (300 mg total) by mouth 3 (three) times daily. Patient not taking: Reported on 12/30/2022 06/17/22   Ashok Pall, MD  lidocaine 4 % Place 1 patch onto the skin 2 (two) times daily. Patient not taking: Reported on 12/30/2022 07/28/22   Varney Biles, MD  Methyl Salicylate-Lido-Menthol 4-4-5 % PTCH Apply 1 patch topically in the morning and at bedtime. Patient not taking: Reported on 12/30/2022 05/18/22   Carmin Muskrat, MD  naproxen (NAPROSYN) 375 MG tablet Take 1 tablet (375 mg total) by mouth 2 (two) times daily. Patient not taking: Reported on 12/30/2022 07/28/22   Varney Biles, MD

## 2023-01-01 NOTE — Anesthesia Preprocedure Evaluation (Addendum)
Anesthesia Evaluation  Patient identified by MRN, date of birth, ID band Patient awake    Reviewed: Allergy & Precautions, NPO status , Patient's Chart, lab work & pertinent test results  Airway Mallampati: II  TM Distance: >3 FB Neck ROM: Full    Dental  (+) Teeth Intact, Dental Advisory Given   Pulmonary neg pulmonary ROS   breath sounds clear to auscultation       Cardiovascular hypertension, Pt. on medications  Rhythm:Regular Rate:Normal     Neuro/Psych negative neurological ROS  negative psych ROS   GI/Hepatic negative GI ROS, Neg liver ROS,,,  Endo/Other  negative endocrine ROS    Renal/GU negative Renal ROS     Musculoskeletal  (+) Arthritis ,    Abdominal   Peds  Hematology   Anesthesia Other Findings   Reproductive/Obstetrics                             Anesthesia Physical Anesthesia Plan  ASA: 2  Anesthesia Plan: General   Post-op Pain Management: Ofirmev IV (intra-op)*   Induction: Intravenous  PONV Risk Score and Plan: 3 and Ondansetron and Treatment may vary due to age or medical condition  Airway Management Planned: Oral ETT  Additional Equipment: None  Intra-op Plan:   Post-operative Plan: Extubation in OR  Informed Consent: I have reviewed the patients History and Physical, chart, labs and discussed the procedure including the risks, benefits and alternatives for the proposed anesthesia with the patient or authorized representative who has indicated his/her understanding and acceptance.     Dental advisory given  Plan Discussed with: CRNA  Anesthesia Plan Comments:        Anesthesia Quick Evaluation

## 2023-01-01 NOTE — Transfer of Care (Signed)
Immediate Anesthesia Transfer of Care Note  Patient: Logan Olson  Procedure(s) Performed: Lumbar Three-Four, Lumbar Four-Five Posterior Lumbar Interbody Fusion, Lumbar Three - Five Hemilaminectomy and Decompression (Back)  Patient Location: PACU  Anesthesia Type:General  Level of Consciousness: awake, alert , and oriented  Airway & Oxygen Therapy: Patient Spontanous Breathing and Patient connected to nasal cannula oxygen  Post-op Assessment: Report given to RN, Post -op Vital signs reviewed and stable, and Patient moving all extremities  Post vital signs: Reviewed and stable  Last Vitals:  Vitals Value Taken Time  BP 112/61 01/01/23 2015  Temp    Pulse 56 01/01/23 2016  Resp 17 01/01/23 2016  SpO2 99 % 01/01/23 2016  Vitals shown include unvalidated device data.  Last Pain:  Vitals:   01/01/23 1220  TempSrc:   PainSc: 5       Patients Stated Pain Goal: 3 ([id number])  Complications: No notable events documented.

## 2023-01-02 MED ORDER — HYDROXYZINE HCL 50 MG/ML IM SOLN: 50.0000 mg | Freq: Four times a day (QID) | INTRAMUSCULAR | Status: DC | PRN

## 2023-01-02 MED ORDER — ACETAMINOPHEN 500 MG PO TABS
1000.0000 mg | ORAL_TABLET | Freq: Four times a day (QID) | ORAL | Status: DC | PRN
  Administered 2023-01-03: 1000 mg via ORAL
  Filled 2023-01-02: qty 2

## 2023-01-02 MED ORDER — OXYCODONE HCL 5 MG PO TABS
10.0000 mg | ORAL_TABLET | ORAL | Status: DC | PRN
  Administered 2023-01-02 – 2023-01-03 (×3): 10 mg via ORAL
  Filled 2023-01-02 (×3): qty 2

## 2023-01-02 MED ORDER — HYDROCODONE-ACETAMINOPHEN 5-325 MG PO TABS
1.0000 | ORAL_TABLET | ORAL | Status: DC | PRN
  Administered 2023-01-02: 2 via ORAL
  Filled 2023-01-02: qty 2

## 2023-01-02 MED ORDER — ACETAMINOPHEN 500 MG PO TABS: 1000.0000 mg | ORAL_TABLET | Freq: Four times a day (QID) | ORAL | Status: DC

## 2023-01-02 NOTE — Evaluation (Signed)
Physical Therapy Evaluation  Patient Details Name: Logan Olson MRN: [id number] DOB: 07-21-1934 Today's Date: 01/02/2023  History of Present Illness  Pt is an 89 y/o male who presents s/p L3-L5 PLIF on 01/01/2023. PMH significant for HTN, prostate CA, prior spinal surgery.   Clinical Impression  Pt admitted with above diagnosis. At the time of PT eval, pt was able to demonstrate transfers and ambulation with gross min guard assist and RW for support. Pt was educated on precautions, positioning recommendations\, appropriate activity progression, and car transfer. Pt currently with functional limitations due to the deficits listed below (see PT Problem List). Pt will benefit from skilled PT to increase their independence and safety with mobility to allow discharge to the venue listed below.         Recommendations for follow up therapy are one component of a multi-disciplinary discharge planning process, led by the attending physician.  Recommendations may be updated based on patient status, additional functional criteria and insurance authorization.  Follow Up Recommendations No PT follow up      Assistance Recommended at Discharge PRN  Patient can return home with the following  A little help with walking and/or transfers;A little help with bathing/dressing/bathroom;Assistance with cooking/housework;Assist for transportation;Help with stairs or ramp for entrance    Equipment Recommendations Rolling walker (2 wheels);BSC/3in1  Recommendations for Other Services       Functional Status Assessment Patient has had a recent decline in their functional status and demonstrates the ability to make significant improvements in function in a reasonable and predictable amount of time.     Precautions / Restrictions Precautions Precautions: Back;Fall Precaution Booklet Issued: Yes (comment) Precaution Comments: Reviewed handout and pt was cued for precautions during functional  mobility. Required Braces or Orthoses:  (No brace needed order) Restrictions Weight Bearing Restrictions: No      Mobility  Bed Mobility               General bed mobility comments: Pt was received sitting up EOB.    Transfers Overall transfer level: Needs assistance Equipment used: Rolling walker (2 wheels) Transfers: Sit to/from Stand Sit to Stand: Supervision           General transfer comment: VC's for hand placement on seated surface for safety. Increased time to power up to full stand. Pt cued for wide BOS when returning to sitting.    Ambulation/Gait Ambulation/Gait assistance: Min guard Gait Distance (Feet): 500 Feet Assistive device: Rolling walker (2 wheels) Gait Pattern/deviations: Step-through pattern, Decreased stride length, Trunk flexed Gait velocity: Decreased Gait velocity interpretation: 1.31 - 2.62 ft/sec, indicative of limited community ambulator   General Gait Details: VC's throughout for improved posture, closer walker proximity and forward gaze. No assist required and no overt LOB noted, however pt moving slow and guarded throughout.  Stairs Stairs: Yes Stairs assistance: Min guard Stair Management: One rail Right, With cane, Step to pattern Number of Stairs: 2 (x2) General stair comments: Use of rails as pt getting used to the stairs. Use of cane as well.  Wheelchair Mobility    Modified Rankin (Stroke Patients Only)       Balance Overall balance assessment: Mild deficits observed, not formally tested   Sitting balance-Leahy Scale: Fair     Standing balance support: No upper extremity supported                                 Pertinent Vitals/Pain  Pain Assessment Pain Assessment: Faces Faces Pain Scale: Hurts a little bit Pain Location: incisional Pain Descriptors / Indicators: Operative site guarding, Sore Pain Intervention(s): Limited activity within patient's tolerance, Monitored during session,  Repositioned    Home Living Family/patient expects to be discharged to:: Private residence Living Arrangements: Spouse/significant other Available Help at Discharge: Family;Available 24 hours/day Type of Home: House Home Access: Stairs to enter Entrance Stairs-Rails: None Entrance Stairs-Number of Steps: 2   Home Layout: One level Home Equipment: Cane - single point      Prior Function Prior Level of Function : Independent/Modified Independent;Driving             Mobility Comments: SPC use on stairs, SPC vs furniture walking in the house       Hand Dominance   Dominant Hand: Right    Extremity/Trunk Assessment   Upper Extremity Assessment Upper Extremity Assessment: Overall WFL for tasks assessed    Lower Extremity Assessment Lower Extremity Assessment: Generalized weakness (Mild; consistent with pre-op diagnosis.)    Cervical / Trunk Assessment Cervical / Trunk Assessment: Back Surgery  Communication   Communication: No difficulties  Cognition Arousal/Alertness: Awake/alert Behavior During Therapy: WFL for tasks assessed/performed Overall Cognitive Status: Within Functional Limits for tasks assessed                                          General Comments      Exercises     Assessment/Plan    PT Assessment Patient needs continued PT services  PT Problem List Decreased strength;Decreased activity tolerance;Decreased range of motion;Decreased balance;Decreased mobility;Decreased knowledge of use of DME;Decreased safety awareness;Decreased knowledge of precautions;Pain       PT Treatment Interventions DME instruction;Gait training;Stair training;Functional mobility training;Therapeutic activities;Therapeutic exercise;Balance training;Patient/family education    PT Goals (Current goals can be found in the Care Plan section)  Acute Rehab PT Goals Patient Stated Goal: Home today PT Goal Formulation: With patient/family Time For Goal  Achievement: 01/09/23 Potential to Achieve Goals: Good    Frequency Min 5X/week     Co-evaluation               AM-PAC PT "6 Clicks" Mobility  Outcome Measure Help needed turning from your back to your side while in a flat bed without using bedrails?: A Little Help needed moving from lying on your back to sitting on the side of a flat bed without using bedrails?: A Little Help needed moving to and from a bed to a chair (including a wheelchair)?: A Little Help needed standing up from a chair using your arms (e.g., wheelchair or bedside chair)?: A Little Help needed to walk in hospital room?: A Little Help needed climbing 3-5 steps with a railing? : A Little 6 Click Score: 18    End of Session Equipment Utilized During Treatment: Gait belt Activity Tolerance: Patient tolerated treatment well Patient left: in bed;with call bell/phone within reach;with family/visitor present Nurse Communication: Mobility status PT Visit Diagnosis: Unsteadiness on feet (R26.81);Pain Pain - part of body:  (back)    Time: 0934-1000 PT Time Calculation (min) (ACUTE ONLY): 26 min   Charges:   PT Evaluation $PT Eval Low Complexity: 1 Low PT Treatments $Gait Training: 8-22 mins        Rolinda Roan, PT, DPT Acute Rehabilitation Services Secure Chat Preferred Office: [phone]   Thelma Comp 01/02/2023, 11:20 AM

## 2023-01-02 NOTE — Progress Notes (Signed)
Patient ID: Logan Olson, male   DOB: [birth year], 89 y.o.   MRN: [id number] BP (!) 127/57 (BP Location: Right Arm)   Pulse 71   Temp 98.5 F (36.9 C) (Oral)   Resp 18   Ht 6' 2"$  (1.88 m)   Wt 109.3 kg   SpO2 95%   BMI 30.94 kg/m  Alert and oriented x4, speech is cleAr and fluent Moving all extremities well.  Wound is clean, dry, no signs of infection Hemovac working well. Possible discharge tomorrow

## 2023-01-02 NOTE — Anesthesia Postprocedure Evaluation (Signed)
Anesthesia Post Note  Patient: Logan Olson  Procedure(s) Performed: Lumbar Three-Four, Lumbar Four-Five Posterior Lumbar Interbody Fusion, Lumbar Three - Five Hemilaminectomy and Decompression (Back)     Patient location during evaluation: PACU Anesthesia Type: General Level of consciousness: awake and alert Pain management: pain level controlled Vital Signs Assessment: post-procedure vital signs reviewed and stable Respiratory status: spontaneous breathing, nonlabored ventilation, respiratory function stable and patient connected to nasal cannula oxygen Cardiovascular status: blood pressure returned to baseline and stable Postop Assessment: no apparent nausea or vomiting Anesthetic complications: no   No notable events documented.  Last Vitals:  Vitals:   01/02/23 1557 01/02/23 1950  BP: 129/65 (!) 127/57  Pulse: (!) 55 71  Resp: 18 18  Temp: 37 C 36.9 C  SpO2: 97% 95%    Last Pain:  Vitals:   01/02/23 2002  TempSrc:   PainSc: [name]

## 2023-01-02 NOTE — Evaluation (Signed)
Occupational Therapy Evaluation Patient Details Name: Logan Olson MRN: [id number] DOB: 1934-03-17 Today's Date: 01/02/2023   History of Present Illness Logan Olson is s/p Lumbar Three-Four, Lumbar Four-Five Posterior Lumbar Interbody Fusion due to spondylolisthesis L3/4,4/5 with radiculopathy.   Clinical Impression   This 89 yo male admitted and underwent above presents to acute OT with all education completed, post op back handout provided, and all questions answered; we will D/C from acute OT without further OT services needed.      Recommendations for follow up therapy are one component of a multi-disciplinary discharge planning process, led by the attending physician.  Recommendations may be updated based on patient status, additional functional criteria and insurance authorization.   Follow Up Recommendations  No OT follow up     Assistance Recommended at Discharge PRN  Patient can return home with the following A little help with bathing/dressing/bathroom;Assistance with cooking/housework;Help with stairs or ramp for entrance;Assist for transportation    Functional Status Assessment  Patient has had a recent decline in their functional status and demonstrates the ability to make significant improvements in function in a reasonable and predictable amount of time. (without further OT needs, education completed)  Equipment Recommendations  BSC/3in1       Precautions / Restrictions Precautions Precautions: Back Precaution Booklet Issued: Yes (comment) Required Braces or Orthoses:  (no brace needed per order) Restrictions Weight Bearing Restrictions: No      Mobility Bed Mobility Overal bed mobility: Modified Independent             General bed mobility comments: VCs for technique    Transfers Overall transfer level: Modified independent Equipment used: Rolling walker (2 wheels)                      Balance Overall balance assessment: Mild  deficits observed, not formally tested                                         ADL either performed or assessed with clinical judgement   ADL                                         General ADL Comments: Educated pt and wife on sit<>stand stance for keeping back straight, LBD, sitting times, when to wear brace, pillow placement for when laying in bed, getting in and out of bed, brushing teeth, back peri care. Pt returned demonstrated toilet transfer, and sit<>stand stance, bed mobility, and simulated LBD.     Vision Patient Visual Report: No change from baseline              Pertinent Vitals/Pain Pain Assessment Pain Assessment: 0-10 Pain Score: 5  Pain Location: incisional Pain Descriptors / Indicators: Aching, Sore Pain Intervention(s): Limited activity within patient's tolerance, Monitored during session, Repositioned     Hand Dominance Right   Extremity/Trunk Assessment Upper Extremity Assessment Upper Extremity Assessment: Overall WFL for tasks assessed           Communication Communication Communication: No difficulties   Cognition Arousal/Alertness: Awake/alert Behavior During Therapy: WFL for tasks assessed/performed Overall Cognitive Status: Within Functional Limits for tasks assessed  Home Living Family/patient expects to be discharged to:: Private residence Living Arrangements: Spouse/significant other Available Help at Discharge: Family;Available 24 hours/day Type of Home: House Home Access: Stairs to enter [employer] of Steps: 2 Entrance Stairs-Rails: None Home Layout: One level     Bathroom Shower/Tub: Occupational psychologist: Standard Bathroom Accessibility: Yes How Accessible: Accessible via walker Home Equipment: [place] - single point          Prior Functioning/Environment Prior Level of Function :  Independent/Modified Independent;Driving                        OT Problem List: Decreased range of motion;Impaired balance (sitting and/or standing);Pain         OT Goals(Current goals can be found in the care plan section) Acute Rehab OT Goals Patient Stated Goal: home when timing is right         AM-PAC OT "6 Clicks" Daily Activity     Outcome Measure Help from another person eating meals?: None Help from another person taking care of personal grooming?: None Help from another person toileting, which includes using toliet, bedpan, or urinal?: None Help from another person bathing (including washing, rinsing, drying)?: A Little Help from another person to put on and taking off regular upper body clothing?: None Help from another person to put on and taking off regular lower body clothing?: A Little 6 Click Score: 22   End of Session Equipment Utilized During Treatment: Gait belt;Rolling walker (2 wheels) (SPC, pt more safe with RW) Nurse Communication:  (pt needs a 3n1)  Activity Tolerance: Patient tolerated treatment well Patient left:  (sitting on EOB with PT getting ready to work with him)  OT Visit Diagnosis: Unsteadiness on feet (R26.81);Other abnormalities of gait and mobility (R26.89);Muscle weakness (generalized) (M62.81);Pain Pain - part of body:  (incisional)                Time: [id number] OT Time Calculation (min): 37 min Charges:  OT General Charges $OT Visit: 1 Visit OT Evaluation $OT Eval Moderate Complexity: 1 Mod OT Treatments $Self Care/Home Management : 8-22 mins  [place] Office [phone]    Logan Olson 01/02/2023, 9:57 AM

## 2023-01-03 MED ORDER — ACETAMINOPHEN 325 MG PO TABS
650.0000 mg | ORAL_TABLET | ORAL | Status: DC | PRN
  Administered 2023-01-03: 650 mg via ORAL
  Filled 2023-01-03: qty 2

## 2023-01-03 MED ORDER — TIZANIDINE HCL 4 MG PO TABS: 4.0000 mg | ORAL_TABLET | Freq: Four times a day (QID) | ORAL | 0 refills | Status: AC | PRN

## 2023-01-03 NOTE — Progress Notes (Signed)
Patient alert and oriented, mae's well, voiding adequate amount of urine, swallowing without difficulty, no c/o pain at time of discharge. Patient discharged home with family. Script and discharged instructions given to patient. Patient and family stated understanding of instructions given. Patient has an appointment with Dr. Christella Noa

## 2023-01-03 NOTE — Discharge Instructions (Addendum)
  Wound Care REMOVE DRESSING ON SUNDAY Leave incision open to air. You may shower. Do not scrub directly on incision.  Do not put any creams, lotions, or ointments on incision. Activity Walk each and every day, increasing distance each day. No lifting greater than 8 lbs.  Avoid bending, arching, and twisting. No driving for 2 weeks; may ride as a passenger locally.  Diet Resume your normal diet.   Call Your Doctor If Any of These Occur Redness, drainage, or swelling at the wound.  Temperature greater than 101 degrees. Severe pain not relieved by pain medication. Incision starts to come apart. Follow Up Appt Call [phone]) for problems.  If you have any hardware placed in your spine, you will need an x-ray before your appointment.         Spinal Fusion Care After Refer to this sheet in the next few weeks. These instructions provide you with information on caring for yourself after your procedure. Your caregiver may also give you more specific instructions. Your treatment has been planned according to current medical practices, but problems sometimes occur. Call your caregiver if you have any problems or questions after your procedure. HOME CARE INSTRUCTIONS  Take whatever pain medicine has been prescribed by your caregiver. Do not take over-the-counter pain medicine unless directed otherwise by your caregiver.  Do not drive if you are taking narcotic pain medicines.  Change your bandage (dressing) if necessary or as directed by your caregiver.  You may shower. The wound may get wet, simply pat the area dry. It will take ~2 weeks for the glue to peel off. If you have been prescribed medicine to prevent your blood from clotting, follow the directions carefully.  Check the area around your incision often. Look for redness and swelling. Also, look for anything leaking from your wound. You can use a mirror or have a family member inspect your incision if it is in a place where it is  difficult for you to see.  Ask your caregiver what activities you should avoid and for how long.  Walk as much as possible.  Do not lift anything heavier than 5 lbs until your caregiver says it is safe.  Do not twist or bend for a few weeks. Try not to pull on things. Avoid sitting for long periods of time. Change positions at least every hour.

## 2023-01-03 NOTE — Discharge Summary (Signed)
Physician Discharge Summary  Patient ID: Logan Olson MRN: [id number] DOB/AGE: 03/21/1934 89 y.o.  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 Logan Olson was admitted and taken to the operating room for lumbar listhesis and stenosis. He underwent a routing lumbar decompression and arthrodesis on the right side. I used stryker cages and nuvasive hardware. Post op he is voiding, ambulating, and tolerating a regular diet. His wound is clean, dry, and without signs of infection.   Treatments: surgery: Lumbar Three-Four, Lumbar Four-Five Posterior Lumbar Interbody Fusion,  Posterolateral arthrodesis L3-5 autograft morsels Spinous process plating L3/4, 4/5 Lumbar Three - Four Hemilaminectomy and Decompression on right side    Discharge Exam: Blood pressure (!) 134/58, pulse 63, temperature 98.5 F (36.9 C), temperature source Oral, resp. rate 16, height 6' 2"$  (1.88 m), weight 109.3 kg, SpO2 100 %. General appearance: alert, cooperative, appears stated age, and no distress Neurologic: Alert and oriented X 3, normal strength and tone. Normal symmetric reflexes. Normal coordination and gait  Disposition: Discharge disposition: 01-Home or Self Care      Spondylolisthesis  Allergies as of 01/03/2023   No Known Allergies      Medication List     TAKE these medications    acetaminophen 500 MG tablet Commonly known as: TYLENOL Take 1 tablet (500 mg total) by mouth every 6 (six) hours as needed. What changed:  how much to take when to take this reasons to take this   amLODipine 5 MG tablet Commonly known as: NORVASC Take 5 mg by mouth daily.   gabapentin 300 MG capsule Commonly known as: NEURONTIN Take 1 capsule (300 mg total) by mouth 3 (three) times  daily.   ibuprofen 200 MG tablet Commonly known as: ADVIL Take 400 mg by mouth as needed for moderate pain or mild pain.   lidocaine 4 % Place 1 patch onto the skin 2 (two) times daily.   losartan 100 MG tablet Commonly known as: COZAAR Take 100 mg by mouth daily.   Methyl Salicylate-Lido-Menthol 4-4-5 % Ptch Apply 1 patch topically in the morning and at bedtime.   naproxen 375 MG tablet Commonly known as: NAPROSYN Take 1 tablet (375 mg total) by mouth 2 (two) times daily.   oxyCODONE 5 MG immediate release tablet Commonly known as: Roxicodone Take 1 tablet (5 mg total) by mouth every 4 (four) hours as needed for severe pain. What changed: when to take this   polyethylene glycol 17 g packet Commonly known as: MiraLax Take 17 g by mouth daily. What changed:  when to take this reasons to take this   pregabalin 50 MG capsule Commonly known as: LYRICA Take 50 mg by mouth 2 (two) times daily.   tiZANidine 4 MG tablet Commonly known as: ZANAFLEX Take 1 tablet (4 mg total) by mouth every 6 (six) hours as needed for muscle spasms.        Follow-up Information     Ashok Pall, MD Follow up.   Specialty: Neurosurgery Why: keep your scheduled appointment Contact information: [address] [place] [address] [phone]                 Signed: Ashok Pall 01/03/2023, 4:13 PM

## 2023-01-03 NOTE — Progress Notes (Signed)
Physical Therapy Treatment Patient Details Name: Logan Olson MRN: [id number] DOB: [birth year] Today's Date: 01/03/2023   History of Present Illness Pt is an 89 y/o male who presents s/p L3-L5 PLIF on 01/01/2023. PMH significant for HTN, prostate CA, prior spinal surgery.    PT Comments    Pt progressing well with post-op mobility. He was able to demonstrate transfers and ambulation with gross min guard assist to supervision for safety and RW for support. Pt utilizing [place] on the stairs but having difficulty managing stairs without railing. Pt was educated on precautions, positioning recommendations, appropriate activity progression, and car transfer. Will continue to follow.      Recommendations for follow up therapy are one component of a multi-disciplinary discharge planning process, led by the attending physician.  Recommendations may be updated based on patient status, additional functional criteria and insurance authorization.  Follow Up Recommendations  No PT follow up     Assistance Recommended at Discharge PRN  Patient can return home with the following A little help with walking and/or transfers;A little help with bathing/dressing/bathroom;Assistance with cooking/housework;Assist for transportation;Help with stairs or ramp for entrance   Equipment Recommendations  Rolling walker (2 wheels);BSC/3in1    Recommendations for Other Services       Precautions / Restrictions Precautions Precautions: Back;Fall Precaution Booklet Issued: Yes (comment) Precaution Comments: Reviewed handout and pt was cued for precautions during functional mobility. Required Braces or Orthoses:  (No brace needed order) Restrictions Weight Bearing Restrictions: No     Mobility  Bed Mobility Overal bed mobility: Needs Assistance Bed Mobility: Rolling, Sidelying to Sit Rolling: Supervision Sidelying to sit: Supervision       General bed mobility comments: VC's for step by step log roll  technique. No assist required but increased time and effort required for trunk elevation to full sitting position.    Transfers Overall transfer level: Needs assistance Equipment used: Rolling walker (2 wheels) Transfers: Sit to/from Stand Sit to Stand: Supervision           General transfer comment: VC's for hand placement on seated surface for safety. Increased time to power up to full stand. Pt cued for wide BOS when returning to sitting.    Ambulation/Gait Ambulation/Gait assistance: Min guard Gait Distance (Feet): 500 Feet Assistive device: Rolling walker (2 wheels) Gait Pattern/deviations: Step-through pattern, Decreased stride length, Trunk flexed Gait velocity: Decreased Gait velocity interpretation: 1.31 - 2.62 ft/sec, indicative of limited community ambulator   General Gait Details: VC's throughout for improved posture, closer walker proximity and forward gaze. No assist required and no overt LOB noted, however pt moving slow and guarded throughout.   Stairs Stairs: Yes Stairs assistance: Min guard Stair Management: One rail Right, With cane, Step to pattern Number of Stairs: 2 (x2) General stair comments: Pt with difficulty attempting without railing use to simulate home environment. Instructed wife in safe HHA and provided gait belt for home use.   Wheelchair Mobility    Modified Rankin (Stroke Patients Only)       Balance Overall balance assessment: Needs assistance Sitting-balance support: No upper extremity supported, Feet supported Sitting balance-Leahy Scale: Fair     Standing balance support: During functional activity, Reliant on assistive device for balance Standing balance-Leahy Scale: Poor                              Cognition Arousal/Alertness: Awake/alert Behavior During Therapy: WFL for tasks assessed/performed Overall Cognitive Status: Within  Functional Limits for tasks assessed                                           Exercises      General Comments        Pertinent Vitals/Pain Pain Assessment Pain Assessment: Faces Faces Pain Scale: Hurts little more Pain Location: incisional Pain Descriptors / Indicators: Operative site guarding, Sore Pain Intervention(s): Limited activity within patient's tolerance, Monitored during session, Repositioned    Home Living                          Prior Function            PT Goals (current goals can now be found in the care plan section) Acute Rehab PT Goals Patient Stated Goal: Home today PT Goal Formulation: With patient/family Time For Goal Achievement: 01/09/23 Potential to Achieve Goals: Good Progress towards PT goals: Progressing toward goals    Frequency    Min 5X/week      PT Plan Current plan remains appropriate    Co-evaluation              AM-PAC PT "6 Clicks" Mobility   Outcome Measure  Help needed turning from your back to your side while in a flat bed without using bedrails?: A Little Help needed moving from lying on your back to sitting on the side of a flat bed without using bedrails?: A Little Help needed moving to and from a bed to a chair (including a wheelchair)?: A Little Help needed standing up from a chair using your arms (e.g., wheelchair or bedside chair)?: A Little Help needed to walk in hospital room?: A Little Help needed climbing 3-5 steps with a railing? : A Little 6 Click Score: 18    End of Session Equipment Utilized During Treatment: Gait belt Activity Tolerance: Patient tolerated treatment well Patient left: in bed;with call bell/phone within reach;with family/visitor present Nurse Communication: Mobility status PT Visit Diagnosis: Unsteadiness on feet (R26.81);Pain Pain - part of body:  (back)     Time: [id number] PT Time Calculation (min) (ACUTE ONLY): 23 min  Charges:  $Gait Training: 23-37 mins                     Rolinda Roan, PT, DPT Acute Rehabilitation  Services Secure Chat Preferred Office: [phone]    Thelma Comp 01/03/2023, 12:27 PM

## 2023-01-09 MED FILL — Sodium Chloride IV Soln 0.9%: INTRAVENOUS | Qty: 1000 | Status: AC

## 2023-01-09 MED FILL — Heparin Sodium (Porcine) Inj 1000 Unit/ML: INTRAMUSCULAR | Qty: 30 | Status: AC

## 2023-01-20 DIAGNOSIS — M4316 Spondylolisthesis, lumbar region: Secondary | ICD-10-CM | POA: Diagnosis not present

## 2023-01-20 DIAGNOSIS — M5416 Radiculopathy, lumbar region: Secondary | ICD-10-CM | POA: Diagnosis not present

## 2023-01-20 DIAGNOSIS — I10 Essential (primary) hypertension: Secondary | ICD-10-CM | POA: Diagnosis not present

## 2023-02-24 DIAGNOSIS — M5416 Radiculopathy, lumbar region: Secondary | ICD-10-CM | POA: Diagnosis not present

## 2023-03-24 DIAGNOSIS — Z6832 Body mass index (BMI) 32.0-32.9, adult: Secondary | ICD-10-CM | POA: Diagnosis not present

## 2023-03-24 DIAGNOSIS — M4316 Spondylolisthesis, lumbar region: Secondary | ICD-10-CM | POA: Diagnosis not present

## 2023-04-10 DIAGNOSIS — M5416 Radiculopathy, lumbar region: Secondary | ICD-10-CM | POA: Diagnosis not present

## 2023-04-10 DIAGNOSIS — I10 Essential (primary) hypertension: Secondary | ICD-10-CM | POA: Diagnosis not present

## 2023-04-17 DIAGNOSIS — C61 Malignant neoplasm of prostate: Secondary | ICD-10-CM | POA: Diagnosis not present

## 2023-04-17 DIAGNOSIS — R972 Elevated prostate specific antigen [PSA]: Secondary | ICD-10-CM | POA: Diagnosis not present

## 2023-05-29 DIAGNOSIS — I10 Essential (primary) hypertension: Secondary | ICD-10-CM | POA: Diagnosis not present

## 2023-05-29 DIAGNOSIS — M5416 Radiculopathy, lumbar region: Secondary | ICD-10-CM | POA: Diagnosis not present

## 2023-08-19 IMAGING — DX DG LUMBAR SPINE COMPLETE 4+V
5 series · 5 of 5 positions shown · non-contrast
Comparison: CT, 10/05/2019.

CLINICAL DATA: Low back pain, left side, x 10 days. Questionable
pulled muscle.

EXAM:
LUMBAR SPINE - COMPLETE 4+ VIEW

[l-spine ap]
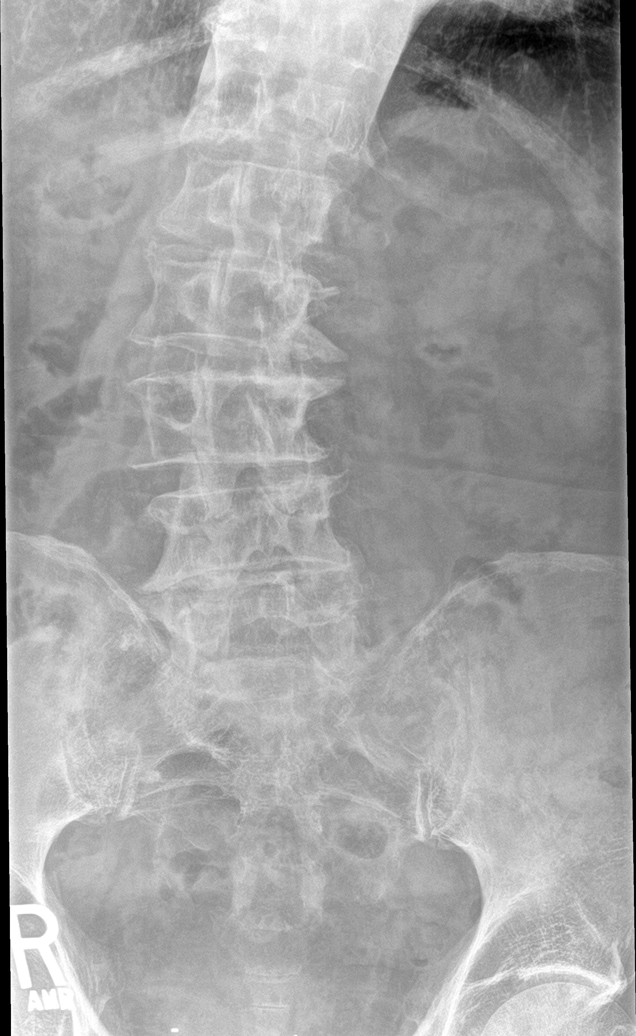

[l-spine obl (1 of 2)]
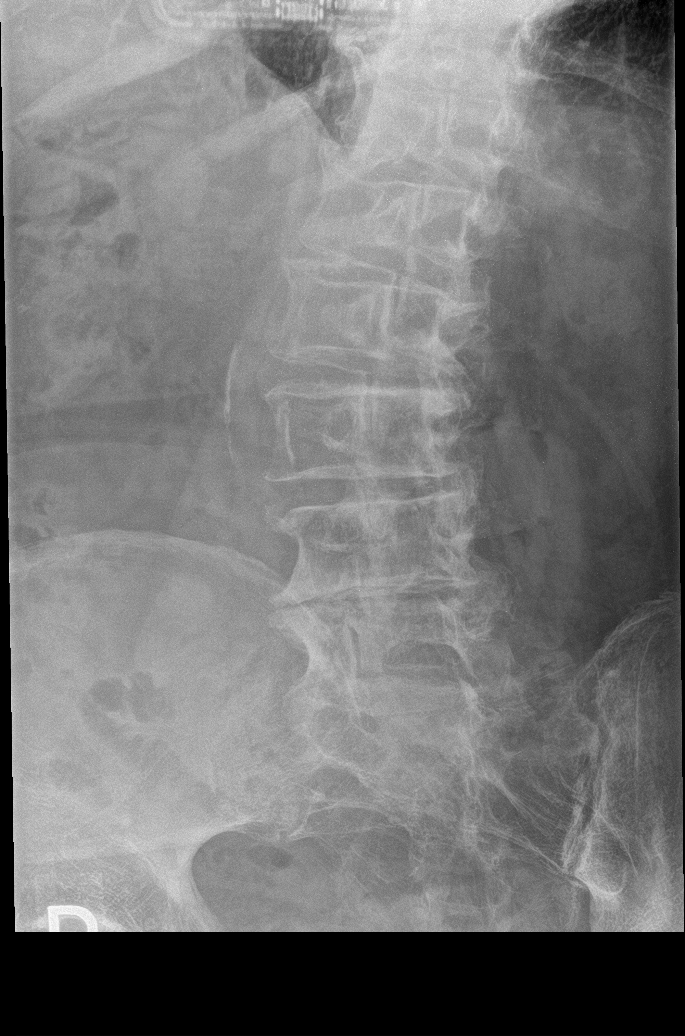

[l-spine obl (2 of 2)]
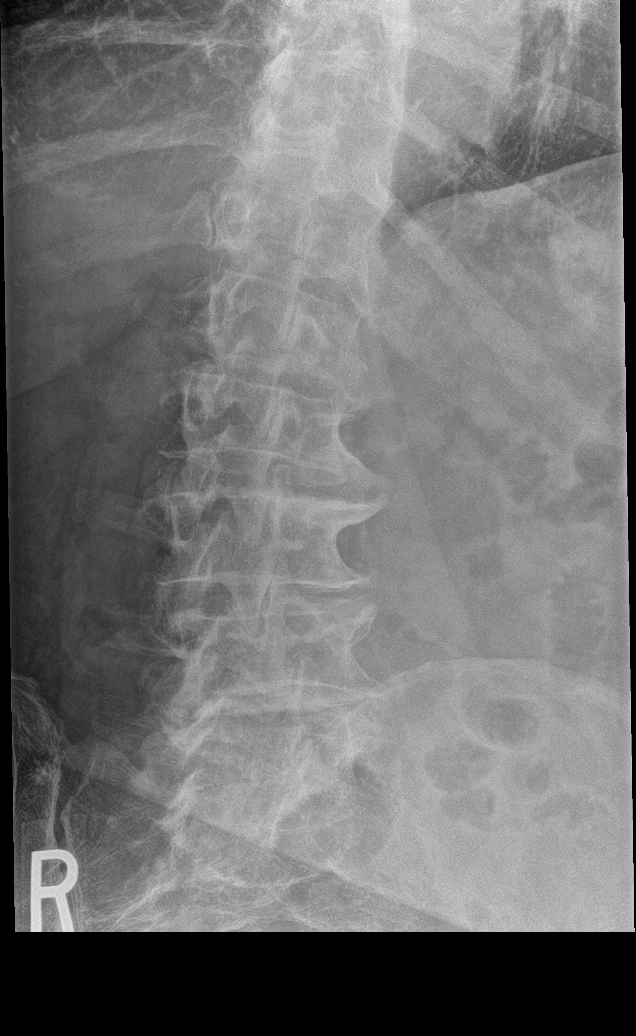

[l-spine lat]
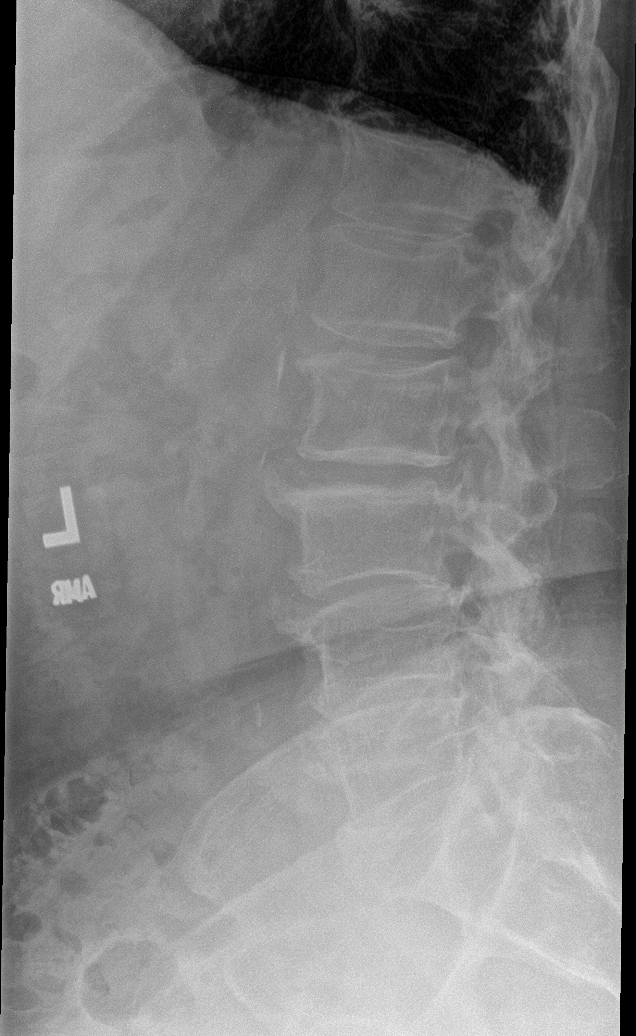

[l-spine spot]
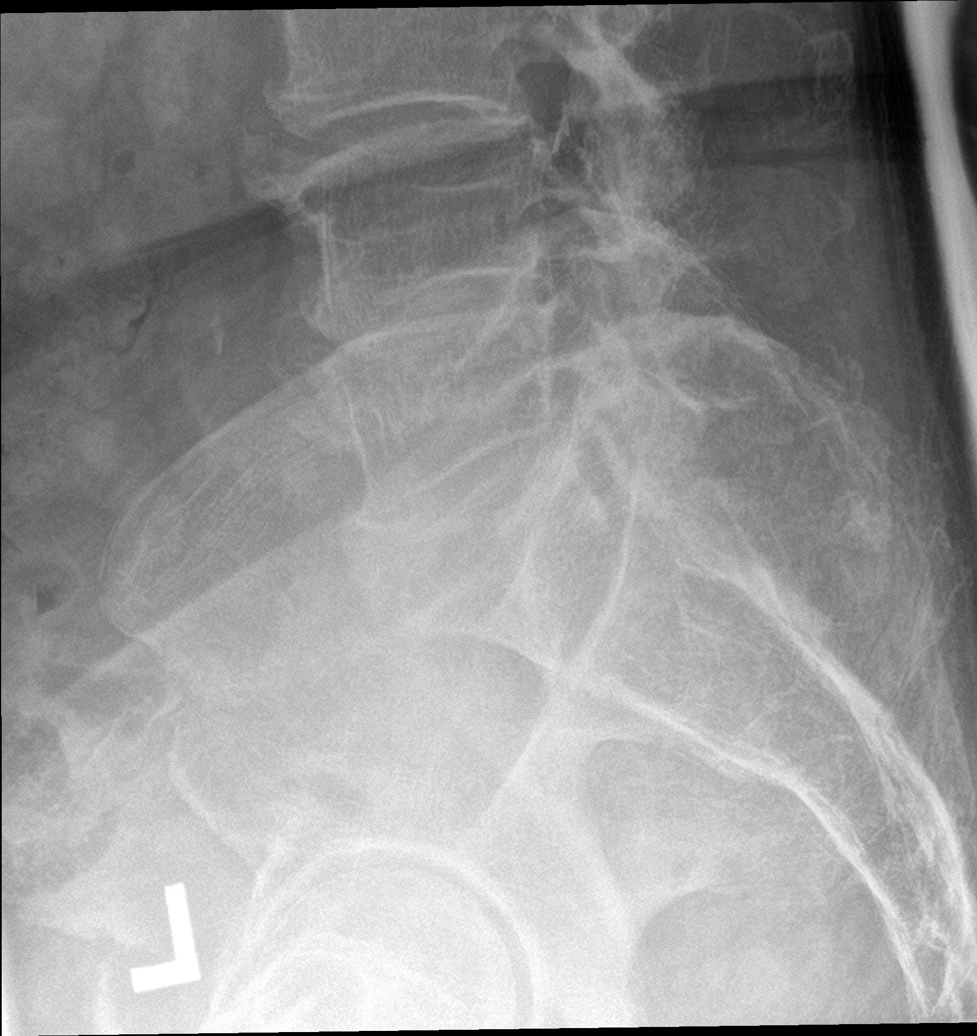

[5 of 5 positions shown; findings below may reference images not displayed]

FINDINGS: Moderate dextroscoliosis. No fracture. Minor, grade 1,
anterolisthesis of L3 on L4. No other spondylolisthesis.

Mild loss of disc height at L3-L4 with moderate loss of disc height
at L4-L5. Mild loss of disc height at L5-S1. There are endplate
osteophytes from L2 through L5 anteriorly. Facet joint narrowing
noted on the right at L3-L4 L4-L5 and on the left at L2-L3 and
L4-L5.

Scattered aortic atherosclerotic calcifications. Soft tissues are
otherwise unremarkable.
IMPRESSION: 1. No fracture or acute finding.
2. Dextroscoliosis and degenerative changes as detailed, stable when
compared to the prior abdomen and pelvis CT.

## 2023-08-27 DIAGNOSIS — Z23 Encounter for immunization: Secondary | ICD-10-CM | POA: Diagnosis not present

## 2023-08-27 DIAGNOSIS — I10 Essential (primary) hypertension: Secondary | ICD-10-CM | POA: Diagnosis not present

## 2023-08-27 DIAGNOSIS — M5416 Radiculopathy, lumbar region: Secondary | ICD-10-CM | POA: Diagnosis not present

## 2023-09-10 DIAGNOSIS — Z6831 Body mass index (BMI) 31.0-31.9, adult: Secondary | ICD-10-CM | POA: Diagnosis not present

## 2023-09-10 DIAGNOSIS — M961 Postlaminectomy syndrome, not elsewhere classified: Secondary | ICD-10-CM | POA: Diagnosis not present

## 2023-09-22 DIAGNOSIS — F4542 Pain disorder with related psychological factors: Secondary | ICD-10-CM | POA: Diagnosis not present

## 2023-10-22 DIAGNOSIS — M5416 Radiculopathy, lumbar region: Secondary | ICD-10-CM | POA: Diagnosis not present

## 2023-10-22 DIAGNOSIS — M961 Postlaminectomy syndrome, not elsewhere classified: Secondary | ICD-10-CM | POA: Diagnosis not present

## 2023-10-30 DIAGNOSIS — R972 Elevated prostate specific antigen [PSA]: Secondary | ICD-10-CM | POA: Diagnosis not present

## 2023-10-30 DIAGNOSIS — C61 Malignant neoplasm of prostate: Secondary | ICD-10-CM | POA: Diagnosis not present

## 2023-12-31 DIAGNOSIS — H524 Presbyopia: Secondary | ICD-10-CM | POA: Diagnosis not present

## 2023-12-31 DIAGNOSIS — H25811 Combined forms of age-related cataract, right eye: Secondary | ICD-10-CM | POA: Diagnosis not present

## 2024-01-07 DIAGNOSIS — M5416 Radiculopathy, lumbar region: Secondary | ICD-10-CM | POA: Diagnosis not present

## 2024-01-07 DIAGNOSIS — I10 Essential (primary) hypertension: Secondary | ICD-10-CM | POA: Diagnosis not present

## 2024-01-29 DIAGNOSIS — R972 Elevated prostate specific antigen [PSA]: Secondary | ICD-10-CM | POA: Diagnosis not present

## 2024-02-03 DIAGNOSIS — H2511 Age-related nuclear cataract, right eye: Secondary | ICD-10-CM | POA: Diagnosis not present

## 2024-02-23 DIAGNOSIS — Z8546 Personal history of malignant neoplasm of prostate: Secondary | ICD-10-CM | POA: Diagnosis not present

## 2024-02-23 DIAGNOSIS — I10 Essential (primary) hypertension: Secondary | ICD-10-CM | POA: Diagnosis not present

## 2024-02-23 DIAGNOSIS — H2511 Age-related nuclear cataract, right eye: Secondary | ICD-10-CM | POA: Diagnosis not present

## 2024-02-23 DIAGNOSIS — F40240 Claustrophobia: Secondary | ICD-10-CM | POA: Diagnosis not present

## 2024-04-07 DIAGNOSIS — D696 Thrombocytopenia, unspecified: Secondary | ICD-10-CM | POA: Diagnosis not present

## 2024-04-07 DIAGNOSIS — M5416 Radiculopathy, lumbar region: Secondary | ICD-10-CM | POA: Diagnosis not present

## 2024-04-07 DIAGNOSIS — D7589 Other specified diseases of blood and blood-forming organs: Secondary | ICD-10-CM | POA: Diagnosis not present

## 2024-04-07 DIAGNOSIS — Z79899 Other long term (current) drug therapy: Secondary | ICD-10-CM | POA: Diagnosis not present

## 2024-04-07 DIAGNOSIS — I10 Essential (primary) hypertension: Secondary | ICD-10-CM | POA: Diagnosis not present

## 2024-04-14 DIAGNOSIS — D519 Vitamin B12 deficiency anemia, unspecified: Secondary | ICD-10-CM | POA: Diagnosis not present

## 2024-04-14 DIAGNOSIS — I10 Essential (primary) hypertension: Secondary | ICD-10-CM | POA: Diagnosis not present

## 2024-04-14 DIAGNOSIS — D696 Thrombocytopenia, unspecified: Secondary | ICD-10-CM | POA: Diagnosis not present

## 2024-04-14 DIAGNOSIS — N289 Disorder of kidney and ureter, unspecified: Secondary | ICD-10-CM | POA: Diagnosis not present

## 2024-05-20 DIAGNOSIS — C61 Malignant neoplasm of prostate: Secondary | ICD-10-CM | POA: Diagnosis not present

## 2024-05-20 DIAGNOSIS — R972 Elevated prostate specific antigen [PSA]: Secondary | ICD-10-CM | POA: Diagnosis not present

## 2024-08-18 DIAGNOSIS — I10 Essential (primary) hypertension: Secondary | ICD-10-CM | POA: Diagnosis not present

## 2024-08-18 DIAGNOSIS — Z79899 Other long term (current) drug therapy: Secondary | ICD-10-CM | POA: Diagnosis not present

## 2024-08-18 DIAGNOSIS — M5416 Radiculopathy, lumbar region: Secondary | ICD-10-CM | POA: Diagnosis not present

## 2024-08-18 DIAGNOSIS — D519 Vitamin B12 deficiency anemia, unspecified: Secondary | ICD-10-CM | POA: Diagnosis not present

## 2024-08-25 DIAGNOSIS — I10 Essential (primary) hypertension: Secondary | ICD-10-CM | POA: Diagnosis not present

## 2024-08-25 DIAGNOSIS — D519 Vitamin B12 deficiency anemia, unspecified: Secondary | ICD-10-CM | POA: Diagnosis not present

## 2024-08-25 DIAGNOSIS — D696 Thrombocytopenia, unspecified: Secondary | ICD-10-CM | POA: Diagnosis not present

## 2024-08-25 DIAGNOSIS — Z23 Encounter for immunization: Secondary | ICD-10-CM | POA: Diagnosis not present

## 2024-08-25 DIAGNOSIS — N183 Chronic kidney disease, stage 3 unspecified: Secondary | ICD-10-CM | POA: Diagnosis not present

## 2024-08-27 ENCOUNTER — Encounter (HOSPITAL_COMMUNITY): Payer: Self-pay | Admitting: Internal Medicine

## 2024-08-27 DIAGNOSIS — R7989 Other specified abnormal findings of blood chemistry: Secondary | ICD-10-CM

## 2024-08-30 ENCOUNTER — Other Ambulatory Visit (HOSPITAL_COMMUNITY): Payer: Self-pay | Admitting: Internal Medicine

## 2024-08-30 DIAGNOSIS — R748 Abnormal levels of other serum enzymes: Secondary | ICD-10-CM

## 2024-09-03 ENCOUNTER — Ambulatory Visit (HOSPITAL_COMMUNITY)
Admission: RE | Admit: 2024-09-03 | Discharge: 2024-09-03 | Disposition: A | Discharge status: Home / Self Care | Source: Ambulatory Visit | Attending: Internal Medicine | Admitting: Internal Medicine

## 2024-09-03 DIAGNOSIS — R748 Abnormal levels of other serum enzymes: Secondary | ICD-10-CM | POA: Diagnosis not present

## 2024-09-03 DIAGNOSIS — R7989 Other specified abnormal findings of blood chemistry: Secondary | ICD-10-CM | POA: Diagnosis not present

## 2024-09-30 DIAGNOSIS — B029 Zoster without complications: Secondary | ICD-10-CM | POA: Diagnosis not present
